# Patient Record
Sex: Female | Born: 1980 | Race: White | Hispanic: No | Marital: Single | Smoking: Current every day smoker
Health system: Southern US, Community
[De-identification: ages and names within clinical notes are randomized; demographics above are authoritative.]

## PROBLEM LIST (undated history)

## (undated) DIAGNOSIS — I10 Essential (primary) hypertension: Secondary | ICD-10-CM

## (undated) DIAGNOSIS — G43909 Migraine, unspecified, not intractable, without status migrainosus: Secondary | ICD-10-CM

## (undated) DIAGNOSIS — E079 Disorder of thyroid, unspecified: Secondary | ICD-10-CM

## (undated) DIAGNOSIS — F32A Depression, unspecified: Secondary | ICD-10-CM

## (undated) DIAGNOSIS — F329 Major depressive disorder, single episode, unspecified: Secondary | ICD-10-CM

## (undated) HISTORY — PX: CHOLECYSTECTOMY: SHX55

---

## 2007-03-13 ENCOUNTER — Ambulatory Visit (HOSPITAL_COMMUNITY): Admission: RE | Admit: 2007-03-13 | Discharge: 2007-03-13 | Payer: Self-pay | Admitting: Family Medicine

## 2007-03-28 ENCOUNTER — Ambulatory Visit: Payer: Self-pay | Admitting: Family Medicine

## 2007-03-29 ENCOUNTER — Ambulatory Visit: Payer: Self-pay | Admitting: Obstetrics & Gynecology

## 2007-04-03 ENCOUNTER — Ambulatory Visit (HOSPITAL_COMMUNITY): Admission: RE | Admit: 2007-04-03 | Discharge: 2007-04-03 | Payer: Self-pay | Admitting: Family Medicine

## 2007-04-10 ENCOUNTER — Ambulatory Visit: Payer: Self-pay | Admitting: Obstetrics and Gynecology

## 2007-04-18 ENCOUNTER — Ambulatory Visit: Payer: Self-pay | Admitting: Obstetrics & Gynecology

## 2007-04-24 ENCOUNTER — Ambulatory Visit (HOSPITAL_COMMUNITY): Admission: RE | Admit: 2007-04-24 | Discharge: 2007-04-24 | Payer: Self-pay | Admitting: Family Medicine

## 2007-05-07 ENCOUNTER — Ambulatory Visit (HOSPITAL_COMMUNITY): Admission: RE | Admit: 2007-05-07 | Discharge: 2007-05-07 | Payer: Self-pay | Admitting: Obstetrics & Gynecology

## 2007-05-16 ENCOUNTER — Ambulatory Visit: Payer: Self-pay | Admitting: *Deleted

## 2007-05-23 ENCOUNTER — Ambulatory Visit (HOSPITAL_COMMUNITY): Admission: RE | Admit: 2007-05-23 | Discharge: 2007-05-23 | Payer: Self-pay | Admitting: Obstetrics & Gynecology

## 2007-06-13 ENCOUNTER — Ambulatory Visit (HOSPITAL_COMMUNITY): Admission: RE | Admit: 2007-06-13 | Discharge: 2007-06-13 | Payer: Self-pay | Admitting: Obstetrics & Gynecology

## 2007-06-13 ENCOUNTER — Ambulatory Visit: Payer: Self-pay | Admitting: Obstetrics & Gynecology

## 2007-07-01 ENCOUNTER — Ambulatory Visit: Payer: Self-pay | Admitting: Family Medicine

## 2007-07-05 ENCOUNTER — Ambulatory Visit: Payer: Self-pay | Admitting: Cardiology

## 2007-07-05 LAB — CONVERTED CEMR LAB
BUN: 6 mg/dL (ref 6–23)
Chloride: 106 meq/L (ref 96–112)
Creatinine, Ser: 0.5 mg/dL (ref 0.4–1.2)
GFR calc non Af Amer: 156 mL/min
Magnesium: 1.9 mg/dL (ref 1.5–2.5)
TSH: 3.12 microintl units/mL (ref 0.35–5.50)

## 2007-07-15 ENCOUNTER — Ambulatory Visit (HOSPITAL_COMMUNITY): Admission: RE | Admit: 2007-07-15 | Discharge: 2007-07-15 | Payer: Self-pay | Admitting: Obstetrics & Gynecology

## 2007-07-15 ENCOUNTER — Ambulatory Visit: Payer: Self-pay | Admitting: Obstetrics & Gynecology

## 2007-07-17 ENCOUNTER — Encounter: Payer: Self-pay | Admitting: Cardiology

## 2007-07-17 ENCOUNTER — Ambulatory Visit: Payer: Self-pay

## 2007-07-29 ENCOUNTER — Ambulatory Visit: Payer: Self-pay | Admitting: Obstetrics & Gynecology

## 2007-08-05 ENCOUNTER — Ambulatory Visit: Payer: Self-pay | Admitting: Obstetrics & Gynecology

## 2007-08-05 ENCOUNTER — Ambulatory Visit (HOSPITAL_COMMUNITY): Admission: RE | Admit: 2007-08-05 | Discharge: 2007-08-05 | Payer: Self-pay | Admitting: Gynecology

## 2007-08-19 ENCOUNTER — Ambulatory Visit: Payer: Self-pay | Admitting: Obstetrics & Gynecology

## 2007-08-22 ENCOUNTER — Ambulatory Visit: Payer: Self-pay | Admitting: Family Medicine

## 2007-08-29 ENCOUNTER — Ambulatory Visit: Payer: Self-pay | Admitting: *Deleted

## 2007-09-02 ENCOUNTER — Ambulatory Visit: Payer: Self-pay | Admitting: Obstetrics & Gynecology

## 2007-09-05 ENCOUNTER — Ambulatory Visit: Payer: Self-pay | Admitting: Obstetrics & Gynecology

## 2007-09-05 ENCOUNTER — Ambulatory Visit (HOSPITAL_COMMUNITY): Admission: RE | Admit: 2007-09-05 | Discharge: 2007-09-05 | Payer: Self-pay | Admitting: Obstetrics & Gynecology

## 2007-09-09 ENCOUNTER — Ambulatory Visit: Payer: Self-pay | Admitting: Gynecology

## 2007-09-12 ENCOUNTER — Ambulatory Visit: Payer: Self-pay | Admitting: Obstetrics & Gynecology

## 2007-09-16 ENCOUNTER — Ambulatory Visit: Payer: Self-pay | Admitting: Obstetrics & Gynecology

## 2007-09-19 ENCOUNTER — Ambulatory Visit: Payer: Self-pay | Admitting: Obstetrics & Gynecology

## 2007-09-19 ENCOUNTER — Ambulatory Visit (HOSPITAL_COMMUNITY): Admission: RE | Admit: 2007-09-19 | Discharge: 2007-09-19 | Payer: Self-pay | Admitting: Family Medicine

## 2007-09-23 ENCOUNTER — Ambulatory Visit: Payer: Self-pay | Admitting: Family Medicine

## 2007-09-27 ENCOUNTER — Ambulatory Visit: Payer: Self-pay | Admitting: Gynecology

## 2007-09-29 ENCOUNTER — Inpatient Hospital Stay (HOSPITAL_COMMUNITY): Admission: AD | Admit: 2007-09-29 | Discharge: 2007-10-02 | Payer: Self-pay | Admitting: Obstetrics & Gynecology

## 2007-09-29 ENCOUNTER — Ambulatory Visit: Payer: Self-pay | Admitting: Obstetrics and Gynecology

## 2008-07-15 IMAGING — US US OB COMP +14 WK
1 series · 4 of 4 positions shown · non-contrast
Comparison: none

OBSTETRICAL ULTRASOUND:

 This ultrasound exam was performed in the [HOSPITAL] Ultrasound Department.  The OB US report was generated in the AS system, and faxed to the ordering physician.  This report is also available in [REDACTED] PACS.

[Series 1: us ob comp +14 wk · 0.28mm/px · 4 of 4 slices shown]
[im 1/4]
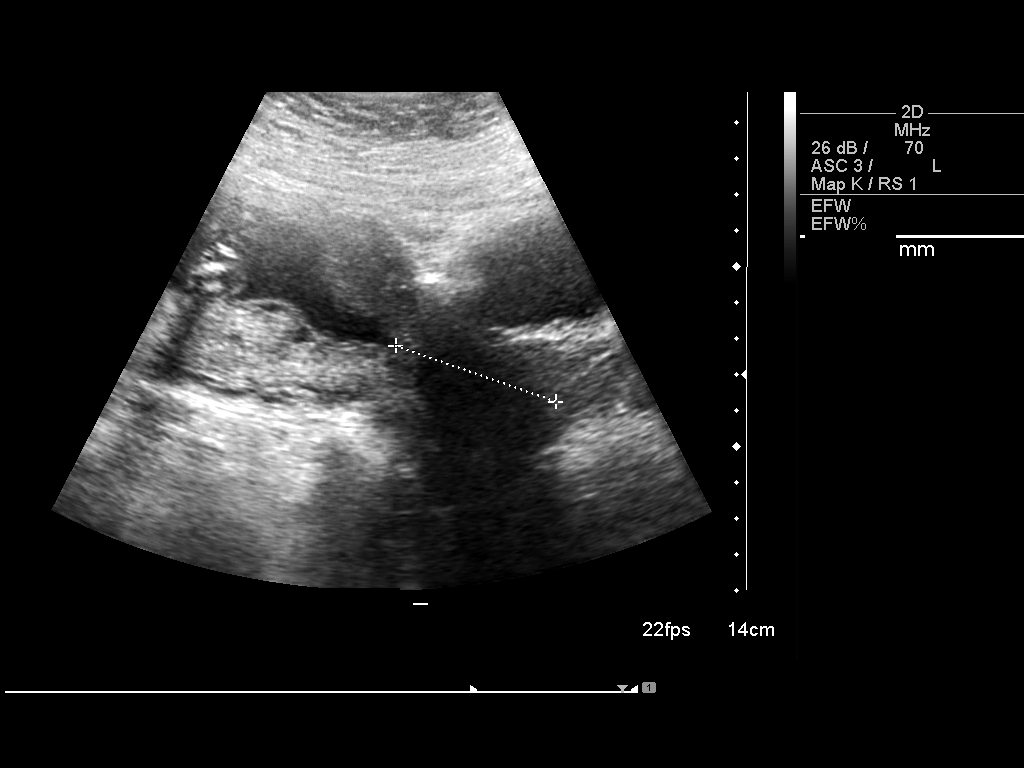
[im 2/4]
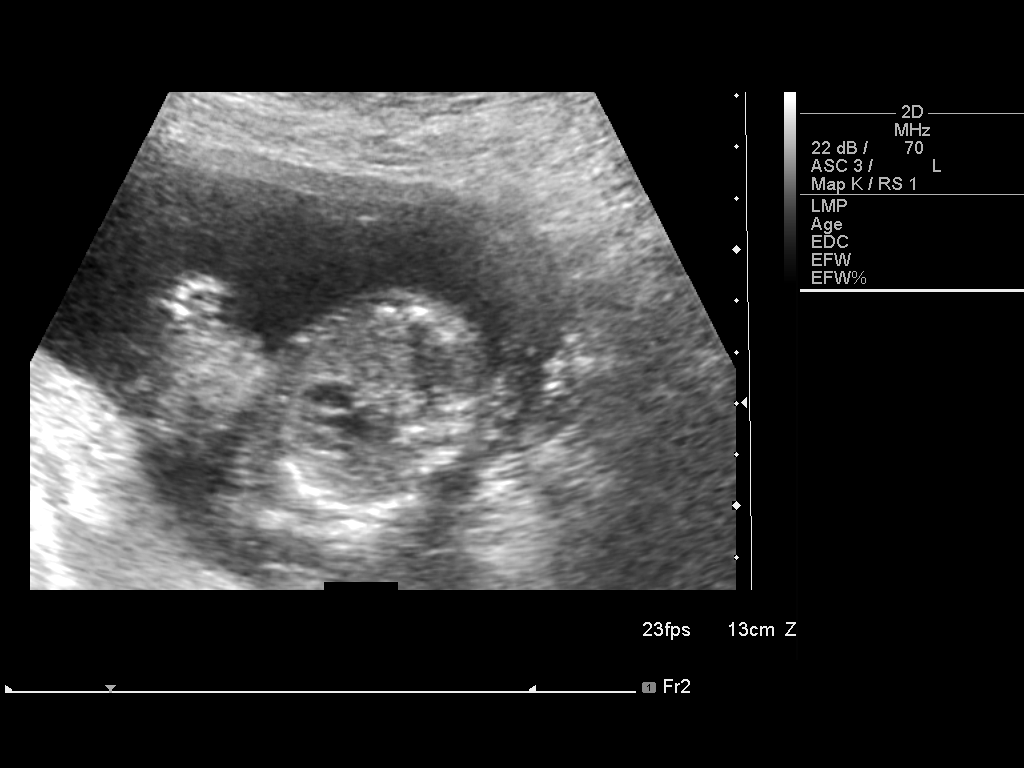
[im 3/4]
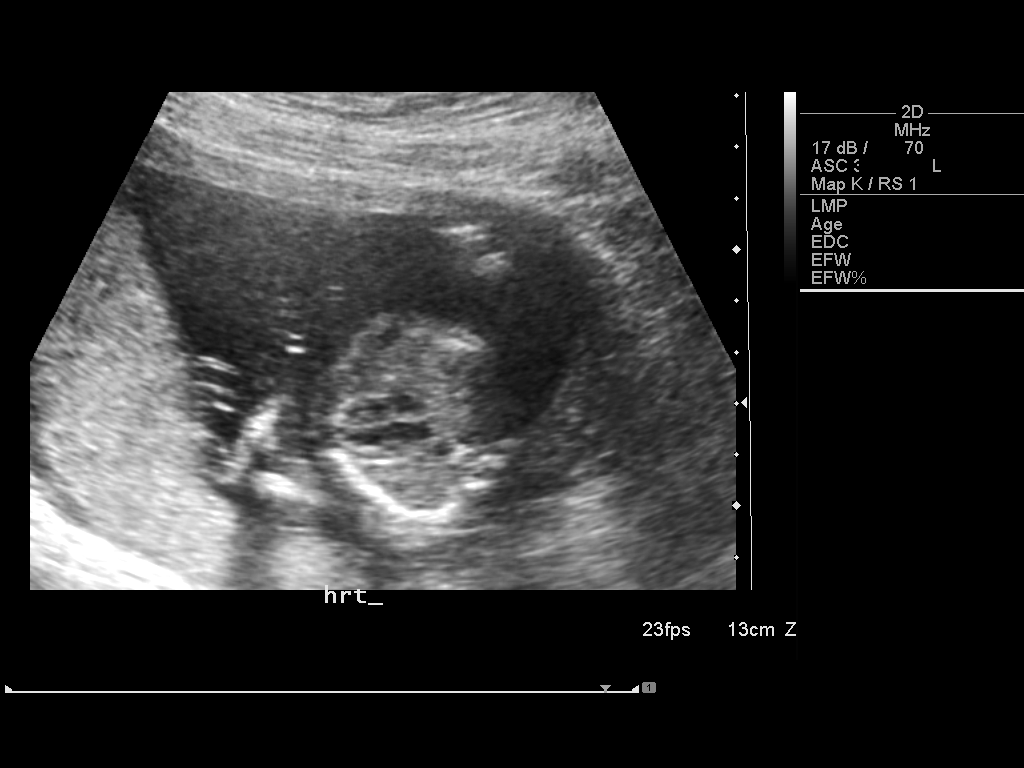
[im 4/4]
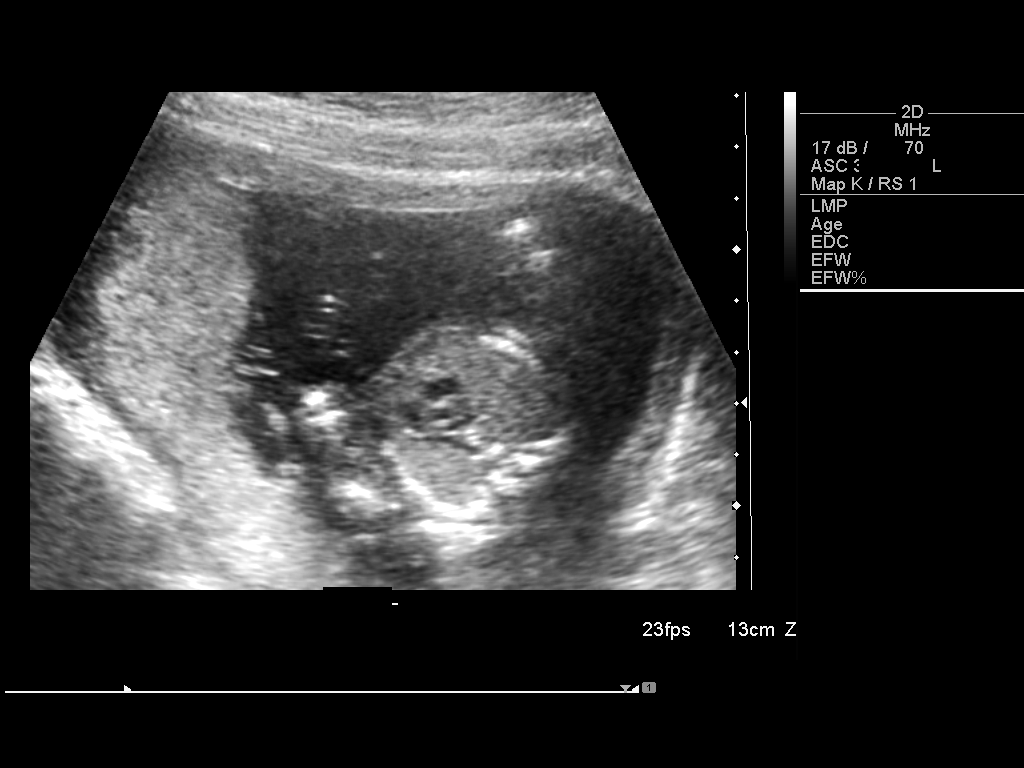

[4 of 4 positions shown; findings below may reference images not displayed]

IMPRESSION: See AS Obstetric US report.

## 2008-11-27 IMAGING — US US OB FOLLOW-UP
1 series · 14 of 28 positions shown · non-contrast
Comparison: none

OBSTETRICAL ULTRASOUND:

 This ultrasound exam was performed in the [HOSPITAL] Ultrasound Department.  The OB US report was generated in the AS system, and faxed to the ordering physician.  This report is also available in [REDACTED] PACS.

[Series 1: us ob re-eval · 14 of 31 slices shown]
[im 2/31]
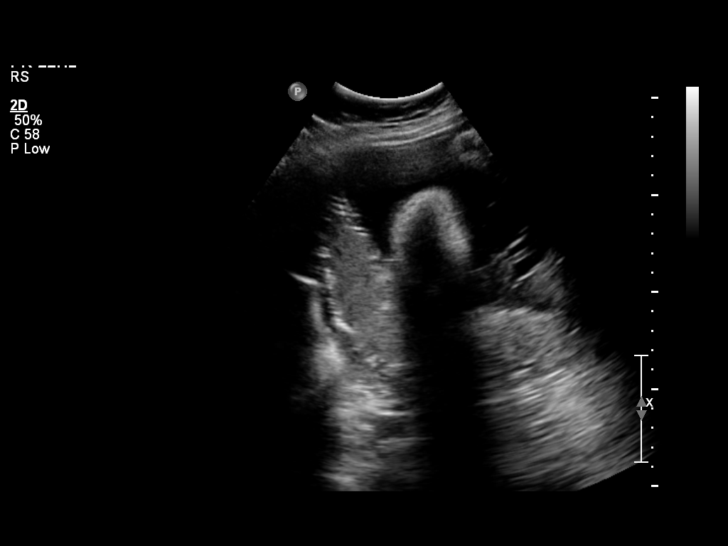
[im 4/31]
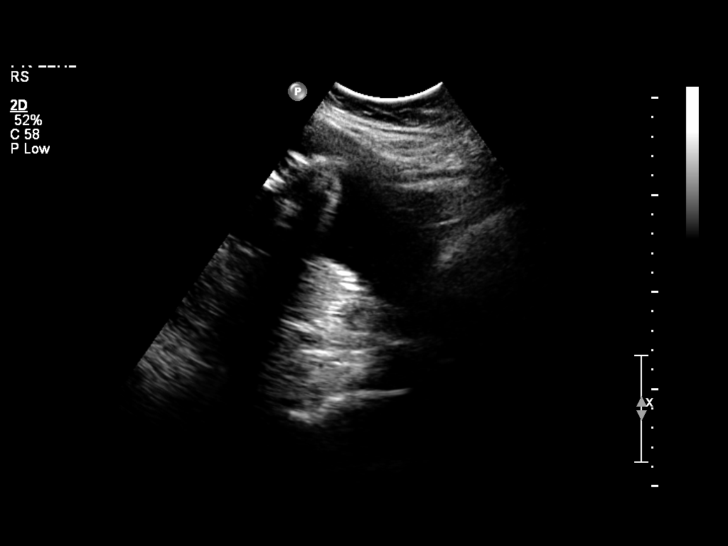
[im 6/31]
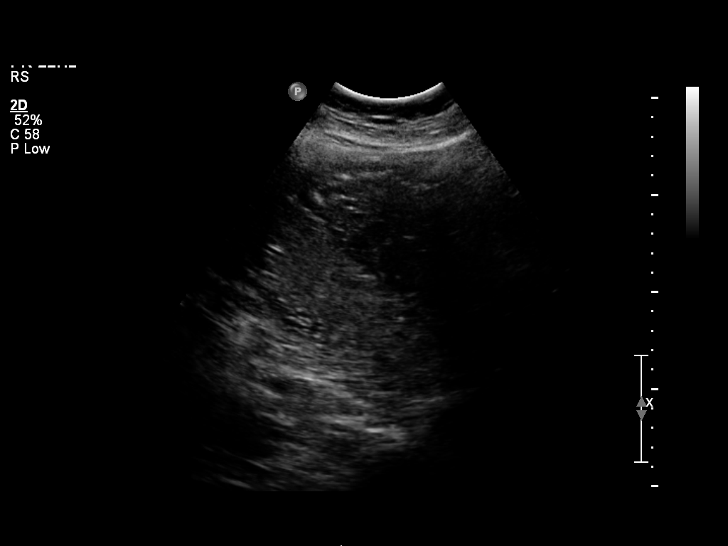
[im 8/31]
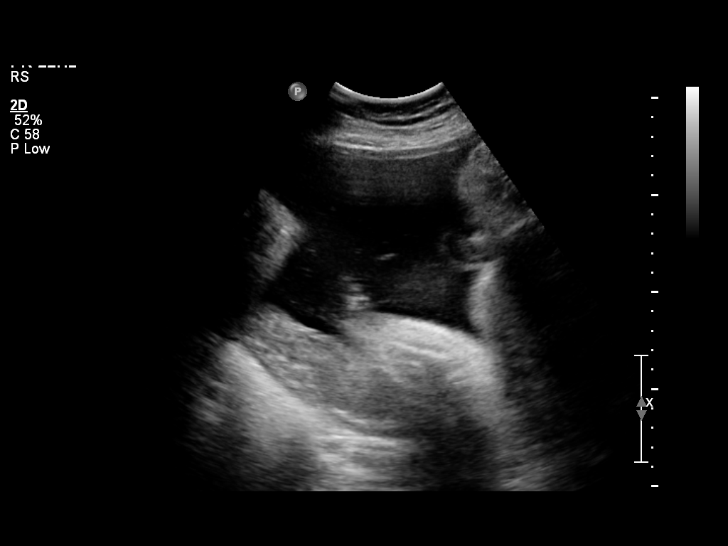
[im 11/31]
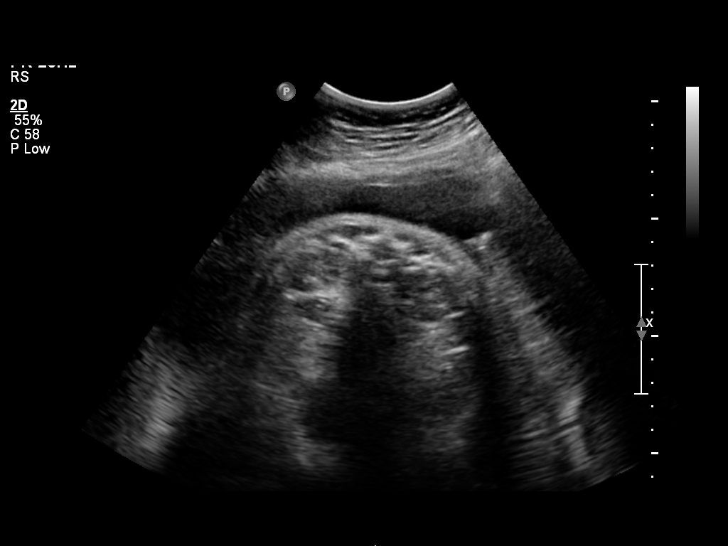
[im 13/31]
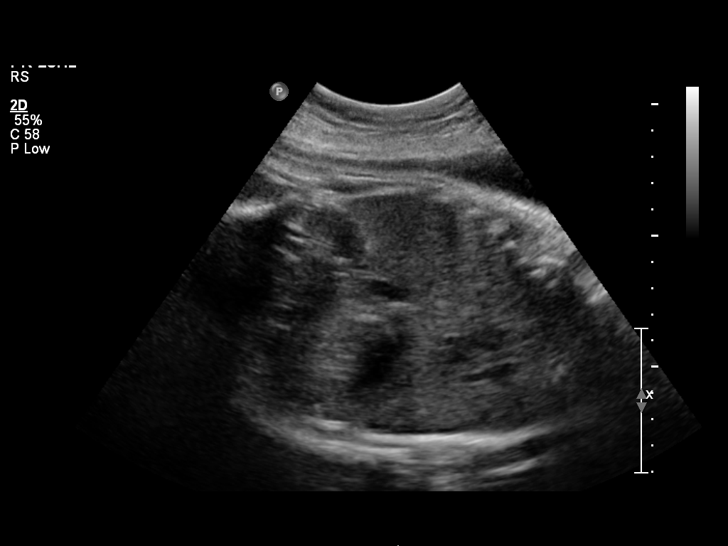
[im 15/31]
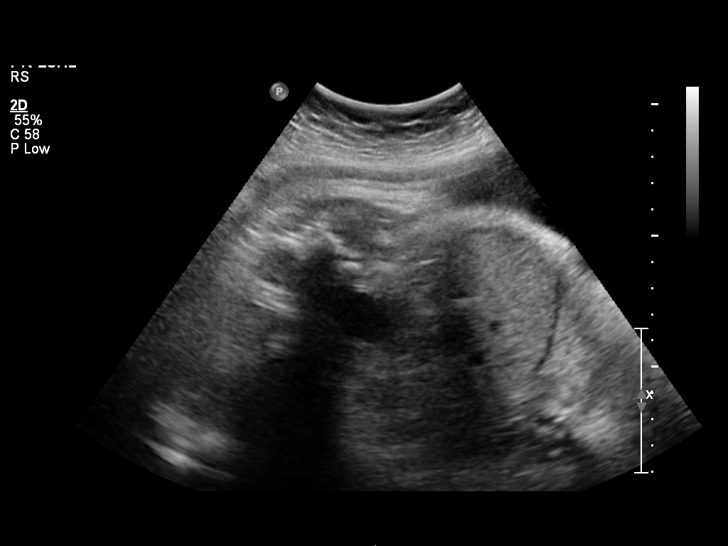
[im 17/31]
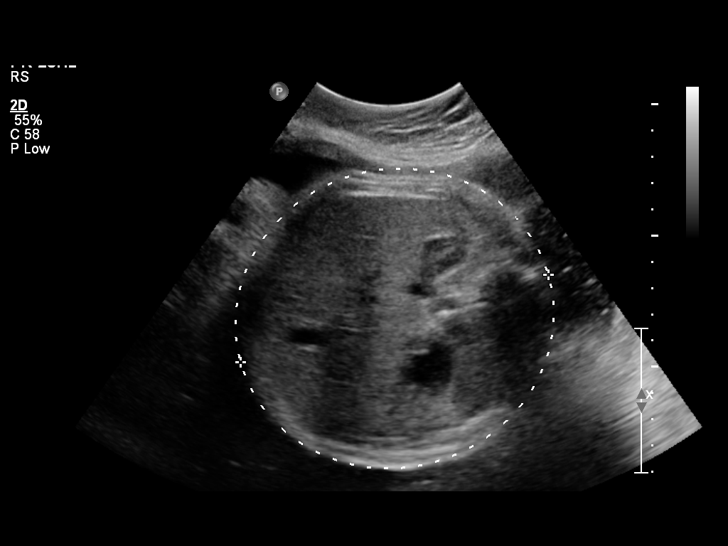
[im 19/31]
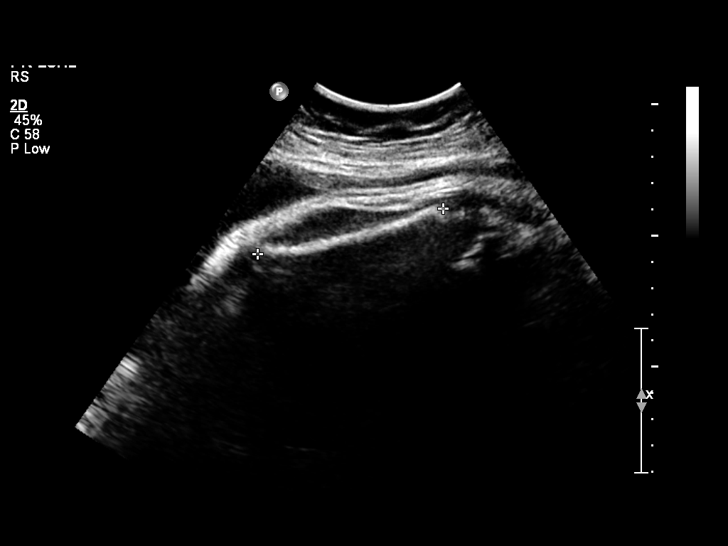
[im 22/31]
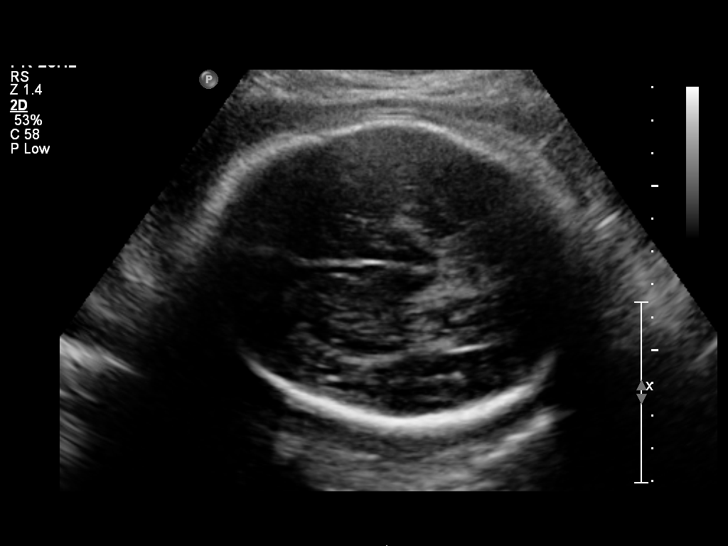
[im 24/31]
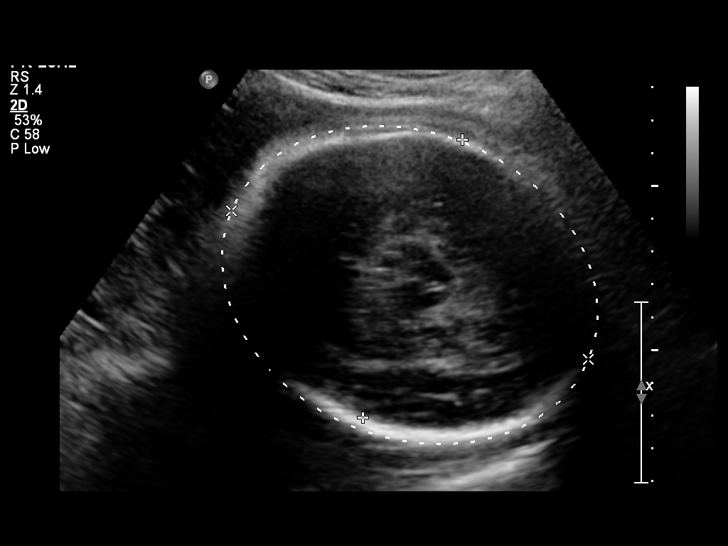
[im 26/31]
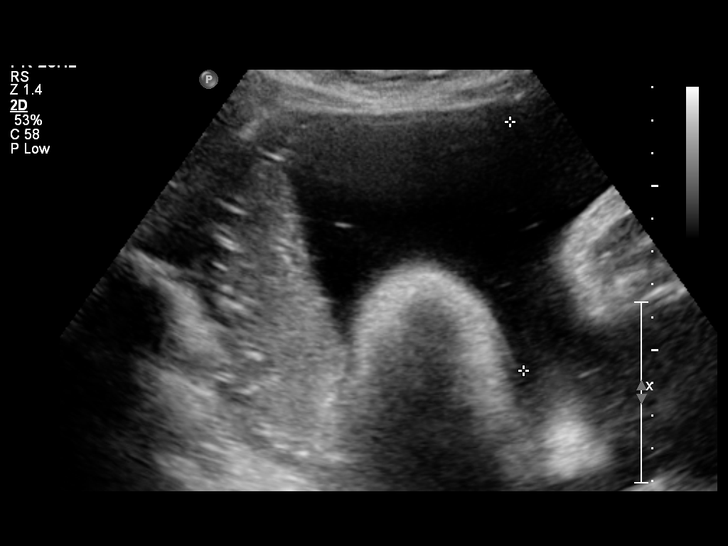
[im 28/31]
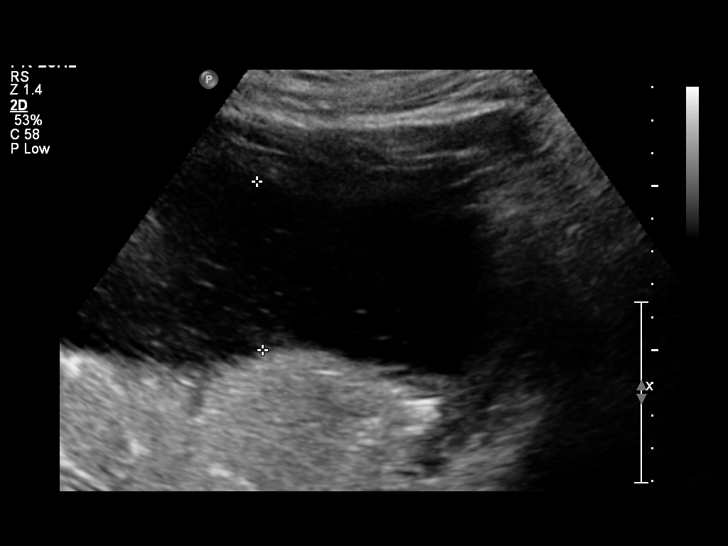
[im 31/31]
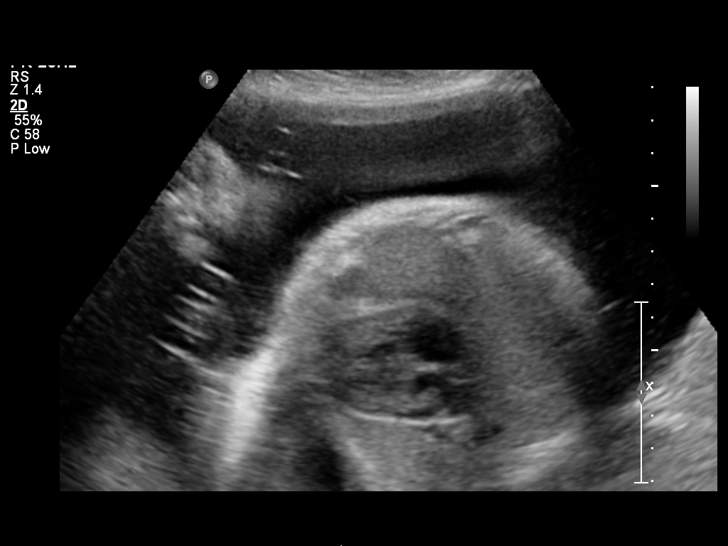

[14 of 28 positions shown; findings below may reference images not displayed]

IMPRESSION: See AS Obstetric US report.

## 2010-01-23 ENCOUNTER — Ambulatory Visit: Payer: Self-pay | Admitting: Diagnostic Radiology

## 2010-01-23 ENCOUNTER — Ambulatory Visit: Payer: Self-pay | Admitting: Gastroenterology

## 2010-01-23 ENCOUNTER — Inpatient Hospital Stay (HOSPITAL_COMMUNITY): Admission: EM | Admit: 2010-01-23 | Discharge: 2010-01-26 | Payer: Self-pay | Admitting: Internal Medicine

## 2010-01-23 ENCOUNTER — Encounter: Payer: Self-pay | Admitting: Emergency Medicine

## 2010-01-25 ENCOUNTER — Encounter (INDEPENDENT_AMBULATORY_CARE_PROVIDER_SITE_OTHER): Payer: Self-pay | Admitting: Internal Medicine

## 2010-01-31 ENCOUNTER — Ambulatory Visit: Payer: Self-pay | Admitting: Internal Medicine

## 2010-02-01 ENCOUNTER — Encounter: Payer: Self-pay | Admitting: Internal Medicine

## 2010-02-02 ENCOUNTER — Encounter: Payer: Self-pay | Admitting: Internal Medicine

## 2010-02-02 LAB — CONVERTED CEMR LAB
Albumin: 4.1 g/dL (ref 3.5–5.2)
Alkaline Phosphatase: 73 units/L (ref 39–117)
Basophils Relative: 0.1 % (ref 0.0–3.0)
Bilirubin, Direct: 0.1 mg/dL (ref 0.0–0.3)
Eosinophils Relative: 3.4 % (ref 0.0–5.0)
Lipase: 23 units/L (ref 11.0–59.0)
MCV: 89.1 fL (ref 78.0–100.0)
Monocytes Absolute: 0.4 10*3/uL (ref 0.1–1.0)
Monocytes Relative: 5.7 % (ref 3.0–12.0)
Neutrophils Relative %: 67 % (ref 43.0–77.0)
Platelets: 231 10*3/uL (ref 150.0–400.0)
RBC: 4.5 M/uL (ref 3.87–5.11)
Total Protein: 7.4 g/dL (ref 6.0–8.3)
WBC: 6.9 10*3/uL (ref 4.5–10.5)

## 2010-02-14 ENCOUNTER — Ambulatory Visit: Payer: Self-pay | Admitting: Internal Medicine

## 2010-02-14 LAB — CONVERTED CEMR LAB
ALT: 19 units/L (ref 0–35)
AST: 13 units/L (ref 0–37)
Alkaline Phosphatase: 68 units/L (ref 39–117)
Bilirubin, Direct: 0.1 mg/dL (ref 0.0–0.3)
Total Bilirubin: 0.3 mg/dL (ref 0.3–1.2)
Total Protein: 6.9 g/dL (ref 6.0–8.3)

## 2010-09-22 ENCOUNTER — Emergency Department (HOSPITAL_BASED_OUTPATIENT_CLINIC_OR_DEPARTMENT_OTHER): Admission: EM | Admit: 2010-09-22 | Discharge: 2010-09-22 | Payer: Self-pay | Admitting: Emergency Medicine

## 2010-09-23 ENCOUNTER — Emergency Department (HOSPITAL_COMMUNITY): Admission: EM | Admit: 2010-09-23 | Discharge: 2010-09-23 | Payer: Self-pay | Admitting: Emergency Medicine

## 2011-01-16 ENCOUNTER — Encounter: Payer: Self-pay | Admitting: *Deleted

## 2011-01-24 NOTE — Letter (Signed)
Summary: Results Letter  [facility]  [address] [place], [place] [address]   Phone: [phone]  Fax: [phone]        February 02, 2010 MRN: [id number]    [facility] [address] [place], [place]  [address]    Dear Ms. Hout,     I have been trying to reach you by phone, but have been unsuccessful, so I am sending this letter. Dr.Sefora Tietje reviewed the labs you had drawn on 01-31-10. Your liver function levels are back to normal, except for one enzyme. Dr.Mauri Temkin would like to repeat your lab work in 2 weeks to follow-up on this. The orders are in the computer for 02-14-10. Please come to the [place] lab M-F, 7:30am-5:30pm, to get those done. If you have any questions please call [phone]. Thank You.       Sincerely,  Laureen Ochs LPN  This letter has been electronically signed by your physician.  Appended Document: Results Letter Letter mailed to patient.

## 2011-01-24 NOTE — Letter (Signed)
Summary: [facility]   Imported By: Sherian Rein 02/16/2010 15:17:30  _____________________________________________________________________  External Attachment:    Type:   Image     Comment:   External Document

## 2011-03-09 LAB — COMPREHENSIVE METABOLIC PANEL
BUN: 10 mg/dL (ref 6–23)
CO2: 26 mEq/L (ref 19–32)
Calcium: 8.7 mg/dL (ref 8.4–10.5)
Creatinine, Ser: 0.7 mg/dL (ref 0.4–1.2)
GFR calc non Af Amer: >60 mL/min (ref >60)
Glucose, Bld: 90 mg/dL (ref 70–99)

## 2011-03-09 LAB — URINALYSIS, ROUTINE W REFLEX MICROSCOPIC
Glucose, UA: NEGATIVE mg/dL
Ketones, ur: 15 mg/dL — AB
Nitrite: NEGATIVE
Protein, ur: NEGATIVE mg/dL
Protein, ur: NEGATIVE mg/dL
Specific Gravity, Urine: 1.021 (ref 1.005–1.030)
Urobilinogen, UA: 0.2 mg/dL (ref 0.0–1.0)
Urobilinogen, UA: 0.2 mg/dL (ref 0.0–1.0)

## 2011-03-09 LAB — CBC
HCT: 42.4 % (ref 36.0–46.0)
Hemoglobin: 13.1 g/dL (ref 12.0–15.0)
MCH: 29 pg (ref 26.0–34.0)
MCH: 29.8 pg (ref 26.0–34.0)
MCHC: 33.3 g/dL (ref 30.0–36.0)
MCV: 87.1 fL (ref 78.0–100.0)
Platelets: 226 10*3/uL (ref 150–400)
RBC: 4.87 MIL/uL (ref 3.87–5.11)
RDW: 12.1 % (ref 11.5–15.5)
WBC: 7.2 10*3/uL (ref 4.0–10.5)

## 2011-03-09 LAB — DIFFERENTIAL
Basophils Absolute: 0 10*3/uL (ref 0.0–0.1)
Basophils Relative: 1 % (ref 0–1)
Eosinophils Absolute: 0.2 10*3/uL (ref 0.0–0.7)
Eosinophils Relative: 4 % (ref 0–5)
Lymphocytes Relative: 32 % (ref 12–46)
Lymphs Abs: 2.3 10*3/uL (ref 0.7–4.0)
Monocytes Absolute: 0.4 10*3/uL (ref 0.1–1.0)
Monocytes Relative: 7 % (ref 3–12)
Neutro Abs: 3.9 10*3/uL (ref 1.7–7.7)
Neutrophils Relative %: 55 % (ref 43–77)

## 2011-03-09 LAB — BASIC METABOLIC PANEL
BUN: 12 mg/dL (ref 6–23)
Calcium: 8.3 mg/dL — ABNORMAL LOW (ref 8.4–10.5)
Creatinine, Ser: 0.6 mg/dL (ref 0.4–1.2)
GFR calc non Af Amer: >60 mL/min (ref >60)
Glucose, Bld: 84 mg/dL (ref 70–99)

## 2011-03-09 LAB — POCT PREGNANCY, URINE: Preg Test, Ur: NEGATIVE

## 2011-03-13 LAB — DIFFERENTIAL
Basophils Absolute: 0.1 10*3/uL (ref 0.0–0.1)
Eosinophils Absolute: 0.2 10*3/uL (ref 0.0–0.7)
Eosinophils Relative: 2 % (ref 0–5)
Lymphocytes Relative: 16 % (ref 12–46)

## 2011-03-13 LAB — URINALYSIS, ROUTINE W REFLEX MICROSCOPIC
Hgb urine dipstick: NEGATIVE
Ketones, ur: NEGATIVE mg/dL
Protein, ur: NEGATIVE mg/dL
Urobilinogen, UA: 1 mg/dL (ref 0.0–1.0)

## 2011-03-13 LAB — CBC
HCT: 36.8 % (ref 36.0–46.0)
Hemoglobin: 13 g/dL (ref 12.0–15.0)
MCV: 86 fL (ref 78.0–100.0)
RBC: 4.27 MIL/uL (ref 3.87–5.11)
WBC: 10.5 10*3/uL (ref 4.0–10.5)

## 2011-03-13 LAB — LIPASE, BLOOD: Lipase: >2000 U/L — ABNORMAL HIGH (ref 23–300)

## 2011-03-13 LAB — COMPREHENSIVE METABOLIC PANEL
AST: 406 U/L — ABNORMAL HIGH (ref 0–37)
Albumin: 3.2 g/dL — ABNORMAL LOW (ref 3.5–5.2)
BUN: 5 mg/dL — ABNORMAL LOW (ref 6–23)
CO2: 26 mEq/L (ref 19–32)
Chloride: 109 mEq/L (ref 96–112)
Creatinine, Ser: 0.69 mg/dL (ref 0.4–1.2)
Creatinine, Ser: 0.7 mg/dL (ref 0.4–1.2)
GFR calc Af Amer: >60 mL/min (ref >60)
GFR calc non Af Amer: >60 mL/min (ref >60)
Glucose, Bld: 91 mg/dL (ref 70–99)
Potassium: 3.7 mEq/L (ref 3.5–5.1)
Total Bilirubin: 1.9 mg/dL — ABNORMAL HIGH (ref 0.3–1.2)
Total Protein: 5.6 g/dL — ABNORMAL LOW (ref 6.0–8.3)

## 2011-03-13 LAB — URINE MICROSCOPIC-ADD ON

## 2011-03-13 LAB — HEMOGLOBIN A1C
Hgb A1c MFr Bld: 5.4 % (ref 4.6–6.1)
Mean Plasma Glucose: 108 mg/dL

## 2011-03-13 LAB — LIPID PANEL: VLDL: 26 mg/dL (ref 0–40)

## 2011-03-13 LAB — HEPATITIS PANEL, ACUTE
HCV Ab: NEGATIVE
Hepatitis B Surface Ag: NEGATIVE

## 2011-03-16 LAB — COMPREHENSIVE METABOLIC PANEL
ALT: 83 U/L — ABNORMAL HIGH (ref 0–35)
Albumin: 3.3 g/dL — ABNORMAL LOW (ref 3.5–5.2)
Alkaline Phosphatase: 66 U/L (ref 39–117)
BUN: 6 mg/dL (ref 6–23)
Chloride: 107 mEq/L (ref 96–112)
Glucose, Bld: 80 mg/dL (ref 70–99)
Potassium: 3.7 mEq/L (ref 3.5–5.1)
Total Bilirubin: 0.5 mg/dL (ref 0.3–1.2)

## 2011-03-16 LAB — CBC
HCT: 33.8 % — ABNORMAL LOW (ref 36.0–46.0)
Hemoglobin: 11.4 g/dL — ABNORMAL LOW (ref 12.0–15.0)
Platelets: 164 10*3/uL (ref 150–400)
WBC: 6.5 10*3/uL (ref 4.0–10.5)

## 2011-03-16 LAB — PROTIME-INR: INR: 1.05 (ref 0.00–1.49)

## 2011-05-09 NOTE — Assessment & Plan Note (Signed)
[facility]                            CARDIOLOGY OFFICE NOTE   NAME:Daum, KIERAN NACHTIGAL                         MRN:          [id number]  DATE:07/05/2007                            DOB:          11/03/1981    CARDIOLOGY CONSULTATION:  I was asked by Dr. Jonette Eva to evaluate  [facility] with new-onset palpitations.   HISTORY OF PRESENT ILLNESS:  She is 30 years of age, single white  female.  She has never had any cardiac issues.   Over the last week or so, she has been having intermittent palpitations  almost daily.  They occur about 5-6 times a day.  They are usually of  single beats and then go away. She has not had any syncope or  presyncope.  She has not had any chest pain or shortness of breath.  There is nothing that provokes and nothing that eases them.   She does smoke about a half pack of cigarettes a day.  She has no  history of thyroid disease.  She does not use any recreational drugs,  specifically cocaine.  She does not drink alcohol.  She does have one  caffeinated soda a day.   Her past medical history otherwise is negative for any drug allergies or  dye allergies.   She is on prenatal vitamins.   FAMILY HISTORY:  There is no premature history of coronary disease in  first-degree relatives.   SOCIAL HISTORY:  She is a Conservation officer, nature.  She stands on her feet 6-8 hours a  day.   REVIEW OF SYSTEMS:  Positive for constipation, fatigue.  Apparently she  had an ulcer five years ago and was placed on Zantac.  Has a history of  gastroesophageal reflux, history of thyroid disease diagnosed in 2008.  She has not had a recent thyroid panel, according to her.  Anxiety,  depression.  The rest of the review of systems are negative.   PHYSICAL EXAMINATION:  GENERAL:  She is very pleasant.  VITAL SIGNS:  Blood pressure is 122/78, pulse is 102.  EKG shows sinus  tach, otherwise normal.  She is 5 feet 5 and weighs 245 pounds.  HEENT:  Normocephalic and  atraumatic.  PERRLA.  Extraocular movements  are intact.  Sclerae are clear.  Facial symmetry is normal.  NECK:  Carotids are equal bilaterally without bruits.  No JVD.  Thyroid  is not enlarged.  Trachea is midline.  LUNGS:  Clear.  HEART:  A poorly appreciated PMI.  She has no mitral valve prolapse,  click, or murmur.  S2 splits physiologically.  ABDOMEN:  Gravid.  There are good bowel sounds.  EXTREMITIES:  No edema.  Pulses are intact.  NEUROLOGIC:  Intact.  SKIN:  Unremarkable.   ASSESSMENT:  New onset of palpitations, probably benign.  Possible  premature atrial contractions, premature ventricular contractions.  We  need to rule out structural heart disease and any electrolyte or thyroid  abnormality.   PLAN:  1. A 2D echocardiogram to rule out structural heart disease.  2. Magnesium level.  3. Check potassium.  4. TSH.   Assuming these are all normal, I will give her simple reassurance.  We  will see her back on a p.r.n. basis.     Thomas C. Daleen Squibb, MD, [facility]  Electronically Signed    TCW/MedQ  DD: 07/05/2007  DT: 07/06/2007  Job #: [id number]   cc:   Shelbie Proctor. Shawnie Pons, M.D.

## 2011-09-02 ENCOUNTER — Encounter: Payer: Self-pay | Admitting: *Deleted

## 2011-09-02 DIAGNOSIS — I10 Essential (primary) hypertension: Secondary | ICD-10-CM | POA: Insufficient documentation

## 2011-09-02 DIAGNOSIS — K029 Dental caries, unspecified: Secondary | ICD-10-CM | POA: Insufficient documentation

## 2011-09-02 DIAGNOSIS — K089 Disorder of teeth and supporting structures, unspecified: Secondary | ICD-10-CM | POA: Insufficient documentation

## 2011-09-02 NOTE — ED Notes (Signed)
Describes toothache since this a.m. (Bottom left)

## 2011-09-03 ENCOUNTER — Emergency Department (HOSPITAL_BASED_OUTPATIENT_CLINIC_OR_DEPARTMENT_OTHER)
Admission: EM | Admit: 2011-09-03 | Discharge: 2011-09-03 | Disposition: A | Discharge status: Home / Self Care | Payer: Self-pay | Attending: Emergency Medicine | Admitting: Emergency Medicine

## 2011-09-03 HISTORY — DX: Essential (primary) hypertension: I10

## 2011-09-03 MED ORDER — PENICILLIN V POTASSIUM 500 MG PO TABS: 500.0000 mg | ORAL_TABLET | Freq: Four times a day (QID) | ORAL | Status: AC

## 2011-09-03 MED ORDER — TRAMADOL HCL 50 MG PO TABS: 50.0000 mg | ORAL_TABLET | Freq: Four times a day (QID) | ORAL | Status: AC | PRN

## 2011-09-03 MED ORDER — AMOXICILLIN 500 MG PO CAPS
500.0000 mg | ORAL_CAPSULE | Freq: Once | ORAL | Status: AC
  Administered 2011-09-03: 500 mg via ORAL
  Filled 2011-09-03: qty 1

## 2011-09-03 MED ORDER — IBUPROFEN 800 MG PO TABS
800.0000 mg | ORAL_TABLET | Freq: Once | ORAL | Status: AC
  Administered 2011-09-03: 800 mg via ORAL
  Filled 2011-09-03: qty 1

## 2011-09-03 MED ORDER — NAPROXEN 500 MG PO TABS: 500.0000 mg | ORAL_TABLET | Freq: Two times a day (BID) | ORAL | Status: AC

## 2011-09-03 NOTE — ED Provider Notes (Signed)
History     CSN: [id number] Arrival date & time: 09/03/2011 12:11 AM  Chief Complaint  Patient presents with  . Dental Pain   HPI Comments: Has not seen a dentist in 8 years. No facial swelling. Has not tried any home remedies.  Patient is a 30 y.o. female presenting with tooth pain. The history is provided by the patient. No language interpreter was used.  Dental PainThe primary symptoms include mouth pain. Primary symptoms do not include dental injury, oral lesions, headaches, fever, shortness of breath, sore throat or cough. The symptoms began 12 to 24 hours ago. The symptoms are worsening. The symptoms are new. The symptoms occur constantly.  Mouth pain occurs constantly. Mouth pain is worsening. Affected locations include: teeth. At its highest the mouth pain was at 10/10.  Additional symptoms include: dental sensitivity to temperature. Additional symptoms do not include: gum swelling, gum tenderness, purulent gums, trismus, jaw pain, facial swelling, trouble swallowing, pain with swallowing, excessive salivation, drooling and fatigue.    Past Medical History  Diagnosis Date  . Hypertension     Past Surgical History  Procedure Date  . Cholecystectomy     History reviewed. No pertinent family history.  History  Substance Use Topics  . Smoking status: Current Everyday Smoker  . Smokeless tobacco: Not on file  . Alcohol Use: No    OB History    Grav Para Term Preterm Abortions TAB SAB Ect Mult Living                  Review of Systems  Constitutional: Negative for fever, chills, activity change, appetite change and fatigue.  HENT: Negative for congestion, sore throat, facial swelling, rhinorrhea, drooling, trouble swallowing, neck pain and neck stiffness.   Respiratory: Negative for cough, chest tightness and shortness of breath.   Cardiovascular: Negative for chest pain and palpitations.  Gastrointestinal: Negative for nausea, vomiting and abdominal pain.    Genitourinary: Negative for dysuria, urgency and frequency.  Neurological: Negative for dizziness, weakness, light-headedness, numbness and headaches.  All other systems reviewed and are negative.    Physical Exam  BP 154/98  Pulse 88  Temp(Src) 98.3 F (36.8 C) (Oral)  Resp 20  Ht 5\' 5"  (1.651 m)  Wt 285 lb (129.275 kg)  BMI 47.43 kg/m2  SpO2 100%  LMP 08/25/2011  Physical Exam  Nursing note and vitals reviewed. Constitutional: She is oriented to person, place, and time.  HENT:  Head: Normocephalic and atraumatic.  Mouth/Throat: No oral lesions. Abnormal dentition. Dental caries present. No dental abscesses.         No facial swelling  Eyes: Conjunctivae and EOM are normal. Pupils are equal, round, and reactive to light.  Neck: Normal range of motion. Neck supple.  Cardiovascular: Normal rate, regular rhythm, normal heart sounds and intact distal pulses.  Exam reveals no gallop and no friction rub.   No murmur heard. Pulmonary/Chest: Effort normal and breath sounds normal. No respiratory distress.  Abdominal: Soft. Bowel sounds are normal. There is no tenderness.  Musculoskeletal: Normal range of motion. She exhibits no tenderness.  Neurological: She is alert and oriented to person, place, and time.  Skin: Skin is warm and dry. No rash noted.    ED Course  Procedures  MDM Dental caries with no sign of abscess. There is no facial swelling or constitutional symptoms. There is no evidence of abscess to the gingiva. Patient has a pretty significant dental carie. Likely root exposure. Will place patient on a  short course of antibiotics, anti-inflammatory medication and pain medication. She is instructed to followup with a dentist as soon as possible as her tooth will likely require removal. There is no evidence of Ludwig's angina or facial abscess.      Dayton Bailiff, MD 09/03/11 [phone]

## 2011-10-05 LAB — POCT URINALYSIS DIP (DEVICE)
Bilirubin Urine: NEGATIVE
Glucose, UA: 500 — AB
Hgb urine dipstick: NEGATIVE
Operator id: 297281
Specific Gravity, Urine: >=1.03
Urobilinogen, UA: 0.2

## 2011-10-05 LAB — CBC
Hemoglobin: 12.9
MCHC: 34.8
MCV: 89.6
Platelets: 181
RBC: 2.91 — ABNORMAL LOW
RDW: 12.8
WBC: 12.4 — ABNORMAL HIGH

## 2011-10-05 LAB — COMPREHENSIVE METABOLIC PANEL
AST: 18
BUN: 7
CO2: 22
Calcium: 8.9
Creatinine, Ser: 0.63
GFR calc Af Amer: >60
GFR calc non Af Amer: >60
Glucose, Bld: 98
Total Bilirubin: 0.3

## 2011-10-05 LAB — LACTATE DEHYDROGENASE: LDH: 166

## 2011-10-05 LAB — URIC ACID: Uric Acid, Serum: 4.4

## 2011-10-06 LAB — POCT URINALYSIS DIP (DEVICE)
Glucose, UA: 100 — AB
Operator id: 120861
Specific Gravity, Urine: >=1.03
Urobilinogen, UA: 1

## 2011-10-09 LAB — POCT URINALYSIS DIP (DEVICE)
Bilirubin Urine: NEGATIVE
Bilirubin Urine: NEGATIVE
Glucose, UA: NEGATIVE
Glucose, UA: NEGATIVE
Glucose, UA: NEGATIVE
Nitrite: NEGATIVE
Nitrite: NEGATIVE
Nitrite: NEGATIVE
Operator id: 120861
Operator id: 194561
Operator id: 14902
Specific Gravity, Urine: 1.025
Specific Gravity, Urine: >=1.03
Urobilinogen, UA: 0.2
Urobilinogen, UA: 0.2
Urobilinogen, UA: 0.2

## 2011-10-10 LAB — POCT URINALYSIS DIP (DEVICE)
Glucose, UA: 250 — AB
Nitrite: NEGATIVE
Operator id: 148111
Urobilinogen, UA: 0.2

## 2011-10-11 LAB — POCT URINALYSIS DIP (DEVICE)
Glucose, UA: NEGATIVE
Hgb urine dipstick: NEGATIVE
Nitrite: NEGATIVE
Operator id: 120861
Urobilinogen, UA: 0.2

## 2012-09-07 ENCOUNTER — Emergency Department (HOSPITAL_BASED_OUTPATIENT_CLINIC_OR_DEPARTMENT_OTHER): Payer: Self-pay

## 2012-09-07 ENCOUNTER — Encounter (HOSPITAL_BASED_OUTPATIENT_CLINIC_OR_DEPARTMENT_OTHER): Payer: Self-pay | Admitting: *Deleted

## 2012-09-07 ENCOUNTER — Emergency Department (HOSPITAL_BASED_OUTPATIENT_CLINIC_OR_DEPARTMENT_OTHER)
Admission: EM | Admit: 2012-09-07 | Discharge: 2012-09-07 | Disposition: A | Discharge status: Home / Self Care | Payer: Self-pay | Attending: Emergency Medicine | Admitting: Emergency Medicine

## 2012-09-07 DIAGNOSIS — F17200 Nicotine dependence, unspecified, uncomplicated: Secondary | ICD-10-CM | POA: Insufficient documentation

## 2012-09-07 DIAGNOSIS — S93402A Sprain of unspecified ligament of left ankle, initial encounter: Secondary | ICD-10-CM

## 2012-09-07 DIAGNOSIS — S93499A Sprain of other ligament of unspecified ankle, initial encounter: Secondary | ICD-10-CM | POA: Insufficient documentation

## 2012-09-07 DIAGNOSIS — W108XXA Fall (on) (from) other stairs and steps, initial encounter: Secondary | ICD-10-CM | POA: Insufficient documentation

## 2012-09-07 DIAGNOSIS — I10 Essential (primary) hypertension: Secondary | ICD-10-CM | POA: Insufficient documentation

## 2012-09-07 MED ORDER — NAPROXEN 500 MG PO TABS: 500.0000 mg | ORAL_TABLET | Freq: Two times a day (BID) | ORAL | Status: AC

## 2012-09-07 MED ORDER — HYDROCODONE-ACETAMINOPHEN 5-325 MG PO TABS: 1.0000 | ORAL_TABLET | Freq: Four times a day (QID) | ORAL | Status: AC | PRN

## 2012-09-07 MED ORDER — HYDROCODONE-ACETAMINOPHEN 5-325 MG PO TABS
1.0000 | ORAL_TABLET | Freq: Once | ORAL | Status: AC
  Administered 2012-09-07: 1 via ORAL
  Filled 2012-09-07: qty 1

## 2012-09-07 NOTE — ED Notes (Signed)
Left ankle injury. Fell down the steps this morning and felt her ankle snap. Now c/o pain and swelling.

## 2012-09-07 NOTE — ED Provider Notes (Signed)
History     CSN: [id number]  Arrival date & time 09/07/12  1118   First MD Initiated Contact with Patient 09/07/12 1146      Chief Complaint  Patient presents with  . Ankle Pain    (Consider location/radiation/quality/duration/timing/severity/associated sxs/prior treatment) Patient is a 31 y.o. female presenting with ankle pain. The history is provided by the patient.  Ankle Pain  The incident occurred 3 to 5 hours ago. The incident occurred at home. Injury mechanism: Tripped with twisting of the ankle. The pain is present in the left ankle. The quality of the pain is described as sharp and aching. The pain is at a severity of 8/10. The pain is moderate. The pain has been constant since onset. Associated symptoms include inability to bear weight and loss of motion. Pertinent negatives include no numbness, no muscle weakness, no loss of sensation and no tingling.    Past Medical History  Diagnosis Date  . Hypertension     Past Surgical History  Procedure Date  . Cholecystectomy     No family history on file.  History  Substance Use Topics  . Smoking status: Current Every Day Smoker  . Smokeless tobacco: Not on file  . Alcohol Use: No    OB History    Grav Para Term Preterm Abortions TAB SAB Ect Mult Living                  Review of Systems  Constitutional: Negative for fever.  HENT: Negative for neck pain.   Respiratory: Negative for shortness of breath.   Cardiovascular: Negative for chest pain.  Gastrointestinal: Negative for abdominal pain.  Genitourinary: Negative for dysuria.  Musculoskeletal: Positive for joint swelling. Negative for back pain.  Skin: Negative for rash.  Neurological: Negative for tingling, numbness and headaches.  Hematological: Does not bruise/bleed easily.    Allergies  Review of patient's allergies indicates no known allergies.  Home Medications   Current Outpatient Rx  Name Route Sig Dispense Refill  .  HYDROCODONE-ACETAMINOPHEN 5-325 MG PO TABS Oral Take 1-2 tablets by mouth every 6 (six) hours as needed for pain. 10 tablet 0  . NAPROXEN 500 MG PO TABS Oral Take 1 tablet (500 mg total) by mouth 2 (two) times daily. 14 tablet 0    BP 150/111  Pulse 97  Temp 97.7 F (36.5 C) (Oral)  Resp 20  SpO2 100%  LMP 09/05/2012  Physical Exam  Nursing note and vitals reviewed. Constitutional: She is oriented to person, place, and time. She appears well-developed and well-nourished.  HENT:  Head: Normocephalic and atraumatic.  Eyes: Conjunctivae normal and EOM are normal. Pupils are equal, round, and reactive to light.  Neck: Normal range of motion. Neck supple.  Cardiovascular: Normal rate, regular rhythm and normal heart sounds.   Pulmonary/Chest: Effort normal and breath sounds normal.  Abdominal: Soft. Bowel sounds are normal. There is no tenderness.  Musculoskeletal: Normal range of motion. She exhibits tenderness.       Normal except for left ankle area with some lateral swelling and tenderness. No obvious deformity no proximal fibular tenderness. Dorsalis pedis pulses 2+ cap refill is 1 second sensation is intact good movement of toes. Limited range of motion of the ankle.  Neurological: She is alert and oriented to person, place, and time. No cranial nerve deficit. She exhibits normal muscle tone. Coordination normal.  Skin: Skin is warm. No rash noted.    ED Course  Procedures (including critical care time)  Labs Reviewed - No data to display Dg Ankle Complete Left  09/07/2012  *RADIOLOGY REPORT*  Clinical Data: Larey Seat down stairs and injured left ankle.  LEFT ANKLE COMPLETE - 3+ VIEW  Comparison: None.  Findings: No evidence of acute fracture or dislocation.  Ankle mortise intact with well-preserved joint space.  Lucency involving the lateral aspect of the talar dome.  Small plantar calcaneal spur.  Accessory ossicle posterior to the talus.  IMPRESSION: No acute osseous abnormality.   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   Original Report Authenticated By: Arnell Sieving, M.D.      1. Left ankle sprain       MDM  X-ray is not consistent with a fracture. Symptoms are consistent with an ankle sprain. Will treat with crutches as needed. ASO wrap to provide support. Orthopedic referral provided. No other injuries.        Shelda Jakes, MD 09/07/12 1230

## 2013-03-05 ENCOUNTER — Encounter (HOSPITAL_BASED_OUTPATIENT_CLINIC_OR_DEPARTMENT_OTHER): Payer: Self-pay | Admitting: *Deleted

## 2013-03-05 ENCOUNTER — Emergency Department (HOSPITAL_BASED_OUTPATIENT_CLINIC_OR_DEPARTMENT_OTHER)
Admission: EM | Admit: 2013-03-05 | Discharge: 2013-03-05 | Disposition: A | Discharge status: Home / Self Care | Payer: Medicaid Other | Attending: Emergency Medicine | Admitting: Emergency Medicine

## 2013-03-05 DIAGNOSIS — F17200 Nicotine dependence, unspecified, uncomplicated: Secondary | ICD-10-CM | POA: Insufficient documentation

## 2013-03-05 DIAGNOSIS — G43909 Migraine, unspecified, not intractable, without status migrainosus: Secondary | ICD-10-CM

## 2013-03-05 DIAGNOSIS — H53149 Visual discomfort, unspecified: Secondary | ICD-10-CM | POA: Insufficient documentation

## 2013-03-05 DIAGNOSIS — I10 Essential (primary) hypertension: Secondary | ICD-10-CM | POA: Insufficient documentation

## 2013-03-05 DIAGNOSIS — R209 Unspecified disturbances of skin sensation: Secondary | ICD-10-CM | POA: Insufficient documentation

## 2013-03-05 HISTORY — DX: Migraine, unspecified, not intractable, without status migrainosus: G43.909

## 2013-03-05 MED ORDER — METOCLOPRAMIDE HCL 5 MG/ML IJ SOLN
10.0000 mg | Freq: Once | INTRAMUSCULAR | Status: AC
  Administered 2013-03-05: 10 mg via INTRAMUSCULAR
  Filled 2013-03-05: qty 2

## 2013-03-05 MED ORDER — SUMATRIPTAN SUCCINATE 6 MG/0.5ML ~~LOC~~ SOLN
6.0000 mg | Freq: Once | SUBCUTANEOUS | Status: AC
  Administered 2013-03-05: 6 mg via SUBCUTANEOUS
  Filled 2013-03-05: qty 0.5

## 2013-03-05 MED ORDER — DIPHENHYDRAMINE HCL 50 MG/ML IJ SOLN
25.0000 mg | Freq: Once | INTRAMUSCULAR | Status: AC
  Administered 2013-03-05: 25 mg via INTRAMUSCULAR
  Filled 2013-03-05: qty 1

## 2013-03-05 NOTE — ED Notes (Signed)
Driving the car and started to get a migraine headache. Numbness to left side of her body that started shortly after. History of migraines with body numbness as a child.

## 2013-03-05 NOTE — ED Provider Notes (Signed)
History     CSN: [id number]  Arrival date & time 03/05/13  1533   First MD Initiated Contact with Patient 03/05/13 1555      Chief Complaint  Patient presents with  . Migraine    (Consider location/radiation/quality/duration/timing/severity/associated sxs/prior treatment) HPI Comments: Patient presents for migraine headache that started 2 hours ago. Patient states the headache started as a dizzy sensation and blurry vision while she was driving her daughter home from school. It then progressed to an aching sensation behind her left eye that is since progressed to her for head and behind her right eye. She also complains of associated left arm numbness and left sided facial numbness which he states is resolving as well as photophobia. Patient has a history of migraine headaches and is not currently being followed by a neurologist. Patient states her last migraine headache was 10 years ago, but that this headache feels like the ones she has had in the past. Patient denies fever, tinnitus, ear pain or discharge, difficulty swallowing, chest pain, shortness of breath, nausea, vomiting, diarrhea, abdominal pain.  Patient is a 32 y.o. female presenting with migraines. The history is provided by the patient. No language interpreter was used.  Migraine Associated symptoms include headaches and numbness. Pertinent negatives include no abdominal pain, chest pain, chills, fever, nausea, neck pain or vomiting.    Past Medical History  Diagnosis Date  . Hypertension   . Migraine     Past Surgical History  Procedure Laterality Date  . Cholecystectomy      No family history on file.  History  Substance Use Topics  . Smoking status: Current Every Day Smoker  . Smokeless tobacco: Not on file  . Alcohol Use: No    OB History   Grav Para Term Preterm Abortions TAB SAB Ect Mult Living                  Review of Systems  Constitutional: Negative for fever and chills.  HENT: Negative for  neck pain and neck stiffness.   Eyes: Positive for photophobia and visual disturbance.  Respiratory: Negative for shortness of breath.   Cardiovascular: Negative for chest pain.  Gastrointestinal: Negative for nausea, vomiting, abdominal pain and diarrhea.  Skin: Negative for color change.  Neurological: Positive for numbness and headaches. Negative for syncope.  All other systems reviewed and are negative.    Allergies  Review of patient's allergies indicates no known allergies.  Home Medications   Current Outpatient Rx  Name  Route  Sig  Dispense  Refill  . naproxen (NAPROSYN) 500 MG tablet   Oral   Take 1 tablet (500 mg total) by mouth 2 (two) times daily.   14 tablet   0     BP 132/84  Pulse 84  Temp(Src) 98.1 F (36.7 C) (Oral)  Resp 16  Ht 5\' 6"  (1.676 m)  Wt 265 lb (120.203 kg)  BMI 42.79 kg/m2  SpO2 98%  Physical Exam  Nursing note and vitals reviewed. Constitutional: She is oriented to person, place, and time. She appears well-developed and well-nourished. No distress.  HENT:  Head: Normocephalic and atraumatic.  Right Ear: External ear normal.  Left Ear: External ear normal.  Mouth/Throat: Oropharynx is clear and moist. No oropharyngeal exudate.  Symmetric rise of the uvula with phonation. Gross hearing intact.   Eyes: Conjunctivae and EOM are normal. Pupils are equal, round, and reactive to light. No scleral icterus.  Neck: Normal range of motion. Neck supple.  Cardiovascular: Normal rate, regular rhythm, normal heart sounds and intact distal pulses.   Pulmonary/Chest: Effort normal and breath sounds normal. No respiratory distress. She has no wheezes. She has no rales.  Abdominal: Soft. She exhibits no distension. There is no tenderness. There is no rebound and no guarding.  Musculoskeletal: Normal range of motion. She exhibits no edema.  Lymphadenopathy:    She has no cervical adenopathy.  Neurological: She is alert and oriented to person, place, and  time. No cranial nerve deficit. Coordination normal.  No sensory or motor deficits; can differentiate between sharp and dull touch. Strength equal b/l in upper and lower extremities.  Skin: Skin is warm and dry. She is not diaphoretic. No erythema.  Psychiatric: She has a normal mood and affect. Her behavior is normal.    ED Course  Procedures (including critical care time)  Labs Reviewed - No data to display No results found.   1. Migraine      MDM  Patient presents with migraine headache similar in onset and quality to the migraines she has had in the past. Patient neurovascularly intact on exam; no indications for imaging at this time. Tx with imitrex, reglan, and benadryl ordered.  Patient states migraine, dizziness and left upper extremity numbness has completely resolved. Patient to be discharged with neurology followup. She states comfort and understanding with this d/c plan. Patient discussed with Dr. Manus Gunning prior to ED d/c.       Antony Madura, PA-C 03/07/13 2010

## 2013-03-08 NOTE — ED Provider Notes (Signed)
Medical screening examination/treatment/procedure(s) were performed by non-physician practitioner and as supervising physician I was immediately available for consultation/collaboration.   Glynn Octave, MD 03/08/13 [phone]

## 2013-06-03 ENCOUNTER — Encounter (HOSPITAL_BASED_OUTPATIENT_CLINIC_OR_DEPARTMENT_OTHER): Payer: Self-pay | Admitting: *Deleted

## 2013-06-03 ENCOUNTER — Emergency Department (HOSPITAL_BASED_OUTPATIENT_CLINIC_OR_DEPARTMENT_OTHER): Payer: Medicaid Other

## 2013-06-03 ENCOUNTER — Emergency Department (HOSPITAL_BASED_OUTPATIENT_CLINIC_OR_DEPARTMENT_OTHER)
Admission: EM | Admit: 2013-06-03 | Discharge: 2013-06-03 | Disposition: A | Discharge status: Home / Self Care | Payer: Medicaid Other | Attending: Emergency Medicine | Admitting: Emergency Medicine

## 2013-06-03 DIAGNOSIS — Z3202 Encounter for pregnancy test, result negative: Secondary | ICD-10-CM | POA: Insufficient documentation

## 2013-06-03 DIAGNOSIS — Z79899 Other long term (current) drug therapy: Secondary | ICD-10-CM | POA: Insufficient documentation

## 2013-06-03 DIAGNOSIS — F17200 Nicotine dependence, unspecified, uncomplicated: Secondary | ICD-10-CM | POA: Insufficient documentation

## 2013-06-03 DIAGNOSIS — N760 Acute vaginitis: Secondary | ICD-10-CM | POA: Insufficient documentation

## 2013-06-03 DIAGNOSIS — Z8679 Personal history of other diseases of the circulatory system: Secondary | ICD-10-CM | POA: Insufficient documentation

## 2013-06-03 DIAGNOSIS — I10 Essential (primary) hypertension: Secondary | ICD-10-CM | POA: Insufficient documentation

## 2013-06-03 DIAGNOSIS — R1033 Periumbilical pain: Secondary | ICD-10-CM | POA: Insufficient documentation

## 2013-06-03 DIAGNOSIS — R110 Nausea: Secondary | ICD-10-CM | POA: Insufficient documentation

## 2013-06-03 DIAGNOSIS — B9689 Other specified bacterial agents as the cause of diseases classified elsewhere: Secondary | ICD-10-CM

## 2013-06-03 DIAGNOSIS — R109 Unspecified abdominal pain: Secondary | ICD-10-CM

## 2013-06-03 DIAGNOSIS — Z9089 Acquired absence of other organs: Secondary | ICD-10-CM | POA: Insufficient documentation

## 2013-06-03 DIAGNOSIS — Z791 Long term (current) use of non-steroidal anti-inflammatories (NSAID): Secondary | ICD-10-CM | POA: Insufficient documentation

## 2013-06-03 LAB — COMPREHENSIVE METABOLIC PANEL
BUN: 16 mg/dL (ref 6–23)
CO2: 21 mEq/L (ref 19–32)
Calcium: 9.7 mg/dL (ref 8.4–10.5)
Creatinine, Ser: 0.8 mg/dL (ref 0.50–1.10)
GFR calc Af Amer: >90 mL/min (ref >90)
GFR calc non Af Amer: >90 mL/min (ref >90)
Glucose, Bld: 97 mg/dL (ref 70–99)

## 2013-06-03 LAB — CBC WITH DIFFERENTIAL/PLATELET
Basophils Absolute: 0 10*3/uL (ref 0.0–0.1)
Eosinophils Relative: 2 % (ref 0–5)
HCT: 43.3 % (ref 36.0–46.0)
Lymphocytes Relative: 18 % (ref 12–46)
Lymphs Abs: 3 10*3/uL (ref 0.7–4.0)
MCV: 84.9 fL (ref 78.0–100.0)
Monocytes Absolute: 0.8 10*3/uL (ref 0.1–1.0)
RDW: 13.4 % (ref 11.5–15.5)
WBC: 16.4 10*3/uL — ABNORMAL HIGH (ref 4.0–10.5)

## 2013-06-03 LAB — PREGNANCY, URINE: Preg Test, Ur: NEGATIVE

## 2013-06-03 LAB — URINALYSIS, ROUTINE W REFLEX MICROSCOPIC
Hgb urine dipstick: NEGATIVE
Protein, ur: NEGATIVE mg/dL
Urobilinogen, UA: 0.2 mg/dL (ref 0.0–1.0)

## 2013-06-03 LAB — LIPASE, BLOOD: Lipase: 17 U/L (ref 11–59)

## 2013-06-03 MED ORDER — IOHEXOL 300 MG/ML  SOLN
100.0000 mL | Freq: Once | INTRAMUSCULAR | Status: AC | PRN
  Administered 2013-06-03: 100 mL via INTRAVENOUS

## 2013-06-03 MED ORDER — METRONIDAZOLE 500 MG PO TABS: 500.0000 mg | ORAL_TABLET | Freq: Two times a day (BID) | ORAL | Status: DC

## 2013-06-03 MED ORDER — FLUCONAZOLE 100 MG PO TABS: 100.0000 mg | ORAL_TABLET | Freq: Every day | ORAL | Status: AC

## 2013-06-03 MED ORDER — IOHEXOL 300 MG/ML  SOLN
50.0000 mL | Freq: Once | INTRAMUSCULAR | Status: AC | PRN
  Administered 2013-06-03: 50 mL via ORAL

## 2013-06-03 MED ORDER — HYDROCODONE-ACETAMINOPHEN 5-325 MG PO TABS: 1.0000 | ORAL_TABLET | ORAL | Status: DC | PRN

## 2013-06-03 NOTE — ED Notes (Signed)
Mid abd pain 2 hours ago. No vomiting no diarrhea. No hx of same.

## 2013-06-03 NOTE — ED Provider Notes (Signed)
Medical screening examination/treatment/procedure(s) were performed by non-physician practitioner and as supervising physician I was immediately available for consultation/collaboration.   Charles B. Bernette Mayers, MD 06/03/13 2100

## 2013-06-03 NOTE — ED Provider Notes (Signed)
History     CSN: [id number]  Arrival date & time 06/03/13  1727   First MD Initiated Contact with Patient 06/03/13 1749      Chief Complaint  Patient presents with  . Abdominal Pain    (Consider location/radiation/quality/duration/timing/severity/associated sxs/prior treatment) HPI Comments: Pt c/o periumbilical pain  Patient is a 32 y.o. female presenting with abdominal pain. The history is provided by the patient. No language interpreter was used.  Abdominal Pain This is a new problem. The current episode started today. The problem occurs constantly. The problem has been unchanged. Associated symptoms include abdominal pain and nausea. Pertinent negatives include no fever, urinary symptoms or vomiting. Nothing aggravates the symptoms. She has tried nothing for the symptoms. The treatment provided no relief.    Past Medical History  Diagnosis Date  . Hypertension   . Migraine     Past Surgical History  Procedure Laterality Date  . Cholecystectomy      No family history on file.  History  Substance Use Topics  . Smoking status: Current Every Day Smoker -- 0.50 packs/day    Types: Cigarettes  . Smokeless tobacco: Not on file  . Alcohol Use: No    OB History   Grav Para Term Preterm Abortions TAB SAB Ect Mult Living                  Review of Systems  Constitutional: Negative for fever.  Respiratory: Negative.   Cardiovascular: Negative.   Gastrointestinal: Positive for nausea and abdominal pain. Negative for vomiting.    Allergies  Review of patient's allergies indicates no known allergies.  Home Medications   Current Outpatient Rx  Name  Route  Sig  Dispense  Refill  . Citalopram Hydrobromide (CELEXA PO)   Oral   Take by mouth.         Marland Kitchen LISINOPRIL PO   Oral   Take by mouth.         . topiramate (TOPAMAX) 50 MG tablet   Oral   Take 50 mg by mouth 2 (two) times daily.         Marland Kitchen VERAPAMIL HCL PO   Oral   Take by mouth.         .  naproxen (NAPROSYN) 500 MG tablet   Oral   Take 1 tablet (500 mg total) by mouth 2 (two) times daily.   14 tablet   0     BP 155/93  Pulse 116  Temp(Src) 99.1 F (37.3 C) (Oral)  Resp 16  Ht 5\' 6"  (1.676 m)  Wt 262 lb (118.842 kg)  BMI 42.31 kg/m2  SpO2 98%  LMP 05/27/2013  Physical Exam  Nursing note and vitals reviewed. Constitutional: She is oriented to person, place, and time. She appears well-developed and well-nourished.  Eyes: Conjunctivae and EOM are normal.  Neck: Normal range of motion. Neck supple.  Cardiovascular: Normal rate and regular rhythm.   Pulmonary/Chest: Effort normal and breath sounds normal.  Abdominal: Soft.  Periumbilical tenderness  Musculoskeletal: Normal range of motion.  Neurological: She is alert and oriented to person, place, and time.  Skin: Skin is warm and dry.  Psychiatric: She has a normal mood and affect.    ED Course  Procedures (including critical care time)  Labs Reviewed  URINALYSIS, ROUTINE W REFLEX MICROSCOPIC - Abnormal; Notable for the following:    Leukocytes, UA TRACE (*)    All other components within normal limits  CBC WITH DIFFERENTIAL - Abnormal; Notable  for the following:    WBC 16.4 (*)    Hemoglobin 15.2 (*)    Neutro Abs 12.2 (*)    All other components within normal limits  COMPREHENSIVE METABOLIC PANEL - Abnormal; Notable for the following:    Total Bilirubin 0.2 (*)    All other components within normal limits  GC/CHLAMYDIA PROBE AMP  WET PREP, GENITAL  PREGNANCY, URINE  URINE MICROSCOPIC-ADD ON  LIPASE, BLOOD   Ct Abdomen Pelvis W Contrast  06/03/2013   *RADIOLOGY REPORT*  Clinical Data: Periumbilical abdominal pain.  Prior cholecystectomy.  CT ABDOMEN AND PELVIS WITH CONTRAST  Technique:  Multidetector CT imaging of the abdomen and pelvis was performed following the standard protocol during bolus administration of intravenous contrast.  Contrast:  OMNIPAQUE IOHEXOL 300 MG/ML  SOLN  Comparison:  09/23/2010  Findings: Prior cholecystectomy again noted.  No evidence of biliary dilatation.  The liver, pancreas, spleen, adrenal glands, and kidneys are normal in appearance.  No evidence of hydronephrosis.  Uterus and adnexae are unremarkable in appearance.  No soft tissue masses or lymphadenopathy identified.  No evidence of inflammatory process or abnormal fluid collections.  No evidence of bowel wall thickening, dilatation, or hernia.  Incidentally noted are bilateral pars defects at L5 with moderate degenerative disc disease at L5-S1.  No evidence of spondylolisthesis.  IMPRESSION:  1.  Stable exam.  No acute findings. 2.  Incidentally noted are bilateral L5 pars defects and L5-S1 degenerative disc disease.   Original Report Authenticated By: Myles Rosenthal, M.D.     1. Abdominal pain   2. BV (bacterial vaginosis)       MDM  No acute abdominal process noted:will treat for bv and with vicodin for pain:pt is comfortable at this time        Teressa Lower, NP 06/03/13 2054

## 2013-06-04 LAB — GC/CHLAMYDIA PROBE AMP
CT Probe RNA: NEGATIVE
GC Probe RNA: NEGATIVE

## 2013-09-23 ENCOUNTER — Encounter (HOSPITAL_BASED_OUTPATIENT_CLINIC_OR_DEPARTMENT_OTHER): Payer: Self-pay

## 2013-09-23 ENCOUNTER — Emergency Department (HOSPITAL_BASED_OUTPATIENT_CLINIC_OR_DEPARTMENT_OTHER)
Admission: EM | Admit: 2013-09-23 | Discharge: 2013-09-23 | Disposition: A | Discharge status: Home / Self Care | Payer: Medicaid Other | Attending: Emergency Medicine | Admitting: Emergency Medicine

## 2013-09-23 DIAGNOSIS — L089 Local infection of the skin and subcutaneous tissue, unspecified: Secondary | ICD-10-CM

## 2013-09-23 DIAGNOSIS — Z792 Long term (current) use of antibiotics: Secondary | ICD-10-CM | POA: Insufficient documentation

## 2013-09-23 DIAGNOSIS — F17200 Nicotine dependence, unspecified, uncomplicated: Secondary | ICD-10-CM | POA: Insufficient documentation

## 2013-09-23 DIAGNOSIS — L723 Sebaceous cyst: Secondary | ICD-10-CM | POA: Insufficient documentation

## 2013-09-23 DIAGNOSIS — G43909 Migraine, unspecified, not intractable, without status migrainosus: Secondary | ICD-10-CM | POA: Insufficient documentation

## 2013-09-23 DIAGNOSIS — Z79899 Other long term (current) drug therapy: Secondary | ICD-10-CM | POA: Insufficient documentation

## 2013-09-23 DIAGNOSIS — I10 Essential (primary) hypertension: Secondary | ICD-10-CM | POA: Insufficient documentation

## 2013-09-23 MED ORDER — LIDOCAINE HCL (PF) 1 % IJ SOLN
5.0000 mL | Freq: Once | INTRAMUSCULAR | Status: AC
  Administered 2013-09-23: 5 mL via INTRADERMAL
  Filled 2013-09-23: qty 5

## 2013-09-23 MED ORDER — SULFAMETHOXAZOLE-TRIMETHOPRIM 800-160 MG PO TABS: 1.0000 | ORAL_TABLET | Freq: Two times a day (BID) | ORAL | Status: DC

## 2013-09-23 MED ORDER — HYDROCODONE-ACETAMINOPHEN 5-325 MG PO TABS: 1.0000 | ORAL_TABLET | ORAL | Status: DC | PRN

## 2013-09-23 NOTE — ED Notes (Signed)
Abscess noted to right side of neck - pt reports area has been there for 9 years, but has worsened over the last week - area is red, inflamed and painful, 8/10.

## 2013-09-23 NOTE — ED Provider Notes (Signed)
TIME SEEN: 9:20 AM  CHIEF COMPLAINT: Infected sebaceous cyst  HPI: Patient is a 32 y.o. female with a history of hypertension, migraines who presents to the emergency department with one week of increased swelling, redness and moderate, sharp pain without radiation to a sebaceous cyst that has been on the base of the right lateral neck or 9 years. She states she's been seen by her doctor who told her that this was nothing to worry about unless it became infected. She states she's had subjective fevers but no chills. No vomiting or diarrhea. No difficulty swallowing or moving her neck. No changes in her voice. States the pain is worse with palpation of the cyst. Denies any alleviating factors. She denies any drainage from the cyst.  ROS: See HPI Constitutional: Subjective fever  Eyes: no drainage  ENT: no runny nose   Cardiovascular:  no chest pain  Resp: no SOB  GI: no vomiting GU: no dysuria Integumentary: no rash  Allergy: no hives  Musculoskeletal: no leg swelling  Neurological: no slurred speech ROS otherwise negative  PAST MEDICAL HISTORY/PAST SURGICAL HISTORY:  Past Medical History  Diagnosis Date  . Hypertension   . Migraine     MEDICATIONS:  Prior to Admission medications   Medication Sig Start Date End Date Taking? Authorizing Provider  Citalopram Hydrobromide (CELEXA PO) Take 20 mg by mouth.    Yes Historical Provider, MD  LISINOPRIL PO Take 25 mg by mouth.    Yes Historical Provider, MD  topiramate (TOPAMAX) 50 MG tablet Take 50 mg by mouth 2 (two) times daily.   Yes Historical Provider, MD  VERAPAMIL HCL PO Take 25 mg by mouth.    Yes Historical Provider, MD  HYDROcodone-acetaminophen (NORCO/VICODIN) 5-325 MG per tablet Take 1 tablet by mouth every 4 (four) hours as needed for pain. 06/03/13   Teressa Lower, NP  metroNIDAZOLE (FLAGYL) 500 MG tablet Take 1 tablet (500 mg total) by mouth 2 (two) times daily. 06/03/13   Teressa Lower, NP    ALLERGIES:  No Known  Allergies  SOCIAL HISTORY:  History  Substance Use Topics  . Smoking status: Current Every Day Smoker -- 0.50 packs/day    Types: Cigarettes  . Smokeless tobacco: Not on file  . Alcohol Use: No    FAMILY HISTORY: History reviewed. No pertinent family history.  EXAM: BP 150/83  Pulse 96  Temp(Src) 98.7 F (37.1 C) (Oral)  Resp 16  Ht 5\' 6"  (1.676 m)  Wt 260 lb (117.935 kg)  BMI 41.99 kg/m2  SpO2 100% CONSTITUTIONAL: Alert and oriented and responds appropriately to questions. Well-appearing; well-nourished HEAD: Normocephalic EYES: Conjunctivae clear, PERRL ENT: normal nose; no rhinorrhea; moist mucous membranes; pharynx without lesions noted;  3 x 4 cm fluctuant area consistent with sebaceous cyst with surrounding 1-2 cm of cellulitis, no drainage, no induration, no cervical lymphadenopathy, no pain with movement of the neck NECK: Supple, no meningismus, no LAD  CARD: RRR; S1 and S2 appreciated; no murmurs, no clicks, no rubs, no gallops RESP: Normal chest excursion without splinting or tachypnea; breath sounds clear and equal bilaterally; no wheezes, no rhonchi, no rales,  ABD/GI: Normal bowel sounds; non-distended; soft, non-tender, no rebound, no guarding BACK:  The back appears normal and is non-tender to palpation, there is no CVA tenderness EXT: Normal ROM in all joints; non-tender to palpation; no edema; normal capillary refill; no cyanosis    SKIN: Normal color for age and race; warm NEURO: Moves all extremities equally PSYCH: The patient's  mood and manner are appropriate. Grooming and personal hygiene are appropriate.  MEDICAL DECISION MAKING: Patient with superficial infected sebaceous cyst. She is hemodynamically stable, nontoxic appearing. Will incise and drain and started on antibiotics given her surrounding cellulitis. Have instructed patient that she will need to followup with her dermatologist or surgeon for further intervention as her cyst may come back. She  verbalized understanding and is comfortable at this plan.  ED PROGRESS:   INCISION AND DRAINAGE Performed by: Raelyn Number Consent: Verbal consent obtained. Risks and benefits: risks, benefits and alternatives were discussed Type: abscess  Body area: Right neck  Anesthesia: local infiltration  Incision was made with a scalpel.  Local anesthetic: lidocaine 1 % without epinephrine  Anesthetic total: 5 ml  Complexity: complex Blunt dissection to break up loculations  Drainage: purulent and sebaceous   Drainage amount: 10 mL   Sterile gauze placed and covered with Tegaderm.   Patient tolerance: Patient tolerated the procedure well with no immediate complications.     Layla Maw Evette Diclemente, DO 09/23/13 1001

## 2013-12-23 ENCOUNTER — Encounter (HOSPITAL_BASED_OUTPATIENT_CLINIC_OR_DEPARTMENT_OTHER): Payer: Self-pay | Admitting: Emergency Medicine

## 2013-12-23 ENCOUNTER — Emergency Department (HOSPITAL_BASED_OUTPATIENT_CLINIC_OR_DEPARTMENT_OTHER)
Admission: EM | Admit: 2013-12-23 | Discharge: 2013-12-23 | Disposition: A | Discharge status: Home / Self Care | Payer: Medicaid Other | Attending: Emergency Medicine | Admitting: Emergency Medicine

## 2013-12-23 DIAGNOSIS — F17200 Nicotine dependence, unspecified, uncomplicated: Secondary | ICD-10-CM | POA: Insufficient documentation

## 2013-12-23 DIAGNOSIS — M545 Low back pain, unspecified: Secondary | ICD-10-CM | POA: Insufficient documentation

## 2013-12-23 DIAGNOSIS — R350 Frequency of micturition: Secondary | ICD-10-CM | POA: Insufficient documentation

## 2013-12-23 DIAGNOSIS — Z79899 Other long term (current) drug therapy: Secondary | ICD-10-CM | POA: Insufficient documentation

## 2013-12-23 DIAGNOSIS — Z3202 Encounter for pregnancy test, result negative: Secondary | ICD-10-CM | POA: Insufficient documentation

## 2013-12-23 DIAGNOSIS — G43909 Migraine, unspecified, not intractable, without status migrainosus: Secondary | ICD-10-CM | POA: Insufficient documentation

## 2013-12-23 DIAGNOSIS — I10 Essential (primary) hypertension: Secondary | ICD-10-CM | POA: Insufficient documentation

## 2013-12-23 DIAGNOSIS — Z791 Long term (current) use of non-steroidal anti-inflammatories (NSAID): Secondary | ICD-10-CM | POA: Insufficient documentation

## 2013-12-23 DIAGNOSIS — M549 Dorsalgia, unspecified: Secondary | ICD-10-CM

## 2013-12-23 LAB — URINALYSIS, ROUTINE W REFLEX MICROSCOPIC
Glucose, UA: NEGATIVE mg/dL
Ketones, ur: NEGATIVE mg/dL
Leukocytes, UA: NEGATIVE
Nitrite: NEGATIVE
Protein, ur: NEGATIVE mg/dL
Specific Gravity, Urine: 1.027 (ref 1.005–1.030)
pH: 6 (ref 5.0–8.0)

## 2013-12-23 LAB — PREGNANCY, URINE: Preg Test, Ur: NEGATIVE

## 2013-12-23 LAB — URINE MICROSCOPIC-ADD ON

## 2013-12-23 MED ORDER — CYCLOBENZAPRINE HCL 10 MG PO TABS: 10.0000 mg | ORAL_TABLET | Freq: Two times a day (BID) | ORAL | Status: DC | PRN

## 2013-12-23 MED ORDER — IBUPROFEN 800 MG PO TABS: 800.0000 mg | ORAL_TABLET | Freq: Three times a day (TID) | ORAL | Status: DC

## 2013-12-23 NOTE — ED Provider Notes (Signed)
Medical screening examination/treatment/procedure(s) were performed by non-physician practitioner and as supervising physician I was immediately available for consultation/collaboration.  EKG Interpretation   None         Holdyn Poyser B. Bernette Mayers, MD 12/23/13 [phone]

## 2013-12-23 NOTE — ED Notes (Signed)
Pt reports back pain and urinary frequency since Friday.

## 2013-12-23 NOTE — ED Provider Notes (Signed)
CSN: [id number]     Arrival date & time 12/23/13  1117 History   First MD Initiated Contact with Patient 12/23/13 1158     Chief Complaint  Patient presents with  . Back Pain  . Urinary Frequency   (Consider location/radiation/quality/duration/timing/severity/associated sxs/prior Treatment) Patient is a 32 y.o. female presenting with back pain and frequency. The history is provided by the patient. No language interpreter was used.  Back Pain Location:  Lumbar spine Quality:  Aching Radiates to:  Does not radiate Pain severity:  Moderate Onset quality:  Gradual Duration:  4 days Associated symptoms: no abdominal pain, no dysuria, no fever, no headaches and no numbness   Associated symptoms comment:  Low back pain without known injury for 4 days. Worse with movement, better with rest. She has had some urinary frequency without dysuria or hematuria. No fever, nausea or vomiting. NO change in appetite. Urinary Frequency Pertinent negatives include no abdominal pain, chills, fever, headaches, nausea, numbness or vomiting.    Past Medical History  Diagnosis Date  . Hypertension   . Migraine    Past Surgical History  Procedure Laterality Date  . Cholecystectomy     No family history on file. History  Substance Use Topics  . Smoking status: Current Every Day Smoker -- 0.50 packs/day    Types: Cigarettes  . Smokeless tobacco: Not on file  . Alcohol Use: No   OB History   Grav Para Term Preterm Abortions TAB SAB Ect Mult Living                 Review of Systems  Constitutional: Negative for fever and chills.  Gastrointestinal: Negative.  Negative for nausea, vomiting and abdominal pain.  Genitourinary: Positive for frequency. Negative for dysuria and hematuria.  Musculoskeletal: Positive for back pain.  Skin: Negative.   Neurological: Negative.  Negative for numbness and headaches.    Allergies  Bactrim  Home Medications   Current Outpatient Rx  Name  Route  Sig   Dispense  Refill  . Citalopram Hydrobromide (CELEXA PO)   Oral   Take 20 mg by mouth.          . cyclobenzaprine (FLEXERIL) 10 MG tablet   Oral   Take 1 tablet (10 mg total) by mouth 2 (two) times daily as needed for muscle spasms.   20 tablet   0   . HYDROcodone-acetaminophen (NORCO/VICODIN) 5-325 MG per tablet   Oral   Take 1 tablet by mouth every 4 (four) hours as needed for pain.   10 tablet   0   . HYDROcodone-acetaminophen (NORCO/VICODIN) 5-325 MG per tablet   Oral   Take 1 tablet by mouth every 4 (four) hours as needed for pain.   15 tablet   0   . ibuprofen (ADVIL,MOTRIN) 800 MG tablet   Oral   Take 1 tablet (800 mg total) by mouth 3 (three) times daily.   21 tablet   0   . LISINOPRIL PO   Oral   Take 25 mg by mouth.          . metroNIDAZOLE (FLAGYL) 500 MG tablet   Oral   Take 1 tablet (500 mg total) by mouth 2 (two) times daily.   14 tablet   0   . sulfamethoxazole-trimethoprim (SEPTRA DS) 800-160 MG per tablet   Oral   Take 1 tablet by mouth every 12 (twelve) hours.   14 tablet   0   . topiramate (TOPAMAX) 50 MG tablet  Oral   Take 50 mg by mouth 2 (two) times daily.         Marland Kitchen VERAPAMIL HCL PO   Oral   Take 25 mg by mouth.           BP 126/76  Pulse 89  Temp(Src) 98.5 F (36.9 C) (Oral)  Resp 16  Ht 5\' 6"  (1.676 m)  Wt 262 lb (118.842 kg)  BMI 42.31 kg/m2  SpO2 99%  LMP 12/22/2013 Physical Exam  Constitutional: She is oriented to person, place, and time. She appears well-developed and well-nourished.  HENT:  Head: Normocephalic.  Neck: Normal range of motion. Neck supple.  Cardiovascular: Normal rate and regular rhythm.   Pulmonary/Chest: Effort normal and breath sounds normal.  Abdominal: Soft. Bowel sounds are normal. There is no tenderness. There is no rebound and no guarding.  Musculoskeletal: Normal range of motion.  Mild lumbar and paralumbar tenderness without swelling or discoloration.  Neurological: She is alert  and oriented to person, place, and time. She has normal reflexes. Coordination normal.  Skin: Skin is warm and dry. No rash noted.  Psychiatric: She has a normal mood and affect.    ED Course  Procedures (including critical care time) Labs Review Labs Reviewed  URINALYSIS, ROUTINE W REFLEX MICROSCOPIC - Abnormal; Notable for the following:    Hgb urine dipstick MODERATE (*)    All other components within normal limits  URINE MICROSCOPIC-ADD ON - Abnormal; Notable for the following:    Squamous Epithelial / LPF FEW (*)    Bacteria, UA FEW (*)    All other components within normal limits  PREGNANCY, URINE   Imaging Review No results found.  EKG Interpretation   None       MDM   1. Back pain    Lower back pain following muscular pattern with negative UA.     Arnoldo Hooker, PA-C 12/23/13 4098

## 2014-12-31 ENCOUNTER — Emergency Department (HOSPITAL_BASED_OUTPATIENT_CLINIC_OR_DEPARTMENT_OTHER)
Admission: EM | Admit: 2014-12-31 | Discharge: 2014-12-31 | Disposition: A | Discharge status: Home / Self Care | Payer: Medicaid Other | Attending: Emergency Medicine | Admitting: Emergency Medicine

## 2014-12-31 ENCOUNTER — Encounter (HOSPITAL_BASED_OUTPATIENT_CLINIC_OR_DEPARTMENT_OTHER): Payer: Self-pay | Admitting: *Deleted

## 2014-12-31 DIAGNOSIS — I10 Essential (primary) hypertension: Secondary | ICD-10-CM | POA: Insufficient documentation

## 2014-12-31 DIAGNOSIS — Z792 Long term (current) use of antibiotics: Secondary | ICD-10-CM | POA: Insufficient documentation

## 2014-12-31 DIAGNOSIS — G43909 Migraine, unspecified, not intractable, without status migrainosus: Secondary | ICD-10-CM | POA: Diagnosis not present

## 2014-12-31 DIAGNOSIS — Z720 Tobacco use: Secondary | ICD-10-CM | POA: Insufficient documentation

## 2014-12-31 DIAGNOSIS — Z79899 Other long term (current) drug therapy: Secondary | ICD-10-CM | POA: Diagnosis not present

## 2014-12-31 DIAGNOSIS — L02414 Cutaneous abscess of left upper limb: Secondary | ICD-10-CM | POA: Diagnosis present

## 2014-12-31 DIAGNOSIS — Z791 Long term (current) use of non-steroidal anti-inflammatories (NSAID): Secondary | ICD-10-CM | POA: Diagnosis not present

## 2014-12-31 DIAGNOSIS — E079 Disorder of thyroid, unspecified: Secondary | ICD-10-CM | POA: Diagnosis not present

## 2014-12-31 HISTORY — DX: Disorder of thyroid, unspecified: E07.9

## 2014-12-31 MED ORDER — IBUPROFEN 800 MG PO TABS: 800.0000 mg | ORAL_TABLET | Freq: Three times a day (TID) | ORAL | Status: DC

## 2014-12-31 MED ORDER — DOXYCYCLINE HYCLATE 100 MG PO CAPS: 100.0000 mg | ORAL_CAPSULE | Freq: Two times a day (BID) | ORAL | Status: DC

## 2014-12-31 MED ORDER — LIDOCAINE HCL (PF) 1 % IJ SOLN
2.0000 mL | Freq: Once | INTRAMUSCULAR | Status: AC
  Administered 2014-12-31: 2 mL
  Filled 2014-12-31: qty 5

## 2014-12-31 NOTE — ED Provider Notes (Signed)
CSN: [id number]     Arrival date & time 12/31/14  [id number] History   First MD Initiated Contact with Patient 12/31/14 [phone]     Chief Complaint  Patient presents with  . Wound Check     (Consider location/radiation/quality/duration/timing/severity/associated sxs/prior Treatment) HPI The patient states she developed a small "blister" on her left forearm a week ago. She reports that it popped and drained a little bit of her purulent looking drainage yesterday and then became very red. Today the redness has spread. She reports is moderately painful. sHe denies any fever or generalized illness or chills. She reports she had an abscess once before several years ago. Past Medical History  Diagnosis Date  . Hypertension   . Migraine   . Thyroid disease    Past Surgical History  Procedure Laterality Date  . Cholecystectomy     History reviewed. No pertinent family history. History  Substance Use Topics  . Smoking status: Current Every Day Smoker -- 0.50 packs/day    Types: Cigarettes  . Smokeless tobacco: Not on file  . Alcohol Use: No   OB History    No data available     Review of Systems Constitutional: No fever no chills or malaise GI: No abdominal pain nausea or vomiting.   Allergies  Bactrim  Home Medications   Prior to Admission medications   Medication Sig Start Date End Date Taking? Authorizing Provider  LEVOTHYROXINE SODIUM PO Take by mouth.   Yes Historical Provider, MD  Citalopram Hydrobromide (CELEXA PO) Take 20 mg by mouth.     Historical Provider, MD  cyclobenzaprine (FLEXERIL) 10 MG tablet Take 1 tablet (10 mg total) by mouth 2 (two) times daily as needed for muscle spasms. 12/23/13   Shari A Upstill, PA-C  doxycycline (VIBRAMYCIN) 100 MG capsule Take 1 capsule (100 mg total) by mouth 2 (two) times daily. One po bid x 7 days 12/31/14   Arby BarretteMarcy Cannie Muckle, MD  HYDROcodone-acetaminophen (NORCO/VICODIN) 5-325 MG per tablet Take 1 tablet by mouth every 4 (four) hours as  needed for pain. 06/03/13   Teressa LowerVrinda Pickering, NP  HYDROcodone-acetaminophen (NORCO/VICODIN) 5-325 MG per tablet Take 1 tablet by mouth every 4 (four) hours as needed for pain. 09/23/13   Kristen N Ward, DO  ibuprofen (ADVIL,MOTRIN) 800 MG tablet Take 1 tablet (800 mg total) by mouth 3 (three) times daily. 12/23/13   Shari A Upstill, PA-C  ibuprofen (ADVIL,MOTRIN) 800 MG tablet Take 1 tablet (800 mg total) by mouth 3 (three) times daily. 12/31/14   Arby BarretteMarcy Jermayne Sweeney, MD  LISINOPRIL PO Take 25 mg by mouth.     Historical Provider, MD  metroNIDAZOLE (FLAGYL) 500 MG tablet Take 1 tablet (500 mg total) by mouth 2 (two) times daily. 06/03/13   Teressa LowerVrinda Pickering, NP  sulfamethoxazole-trimethoprim (SEPTRA DS) 800-160 MG per tablet Take 1 tablet by mouth every 12 (twelve) hours. 09/23/13   Kristen N Ward, DO  topiramate (TOPAMAX) 50 MG tablet Take 50 mg by mouth 2 (two) times daily.    Historical Provider, MD  VERAPAMIL HCL PO Take 25 mg by mouth.     Historical Provider, MD   BP 137/94 mmHg  Pulse 113  Temp(Src) 98.1 F (36.7 C) (Oral)  Ht 5\' 6"  (1.676 m)  Wt 267 lb (121.11 kg)  BMI 43.12 kg/m2  SpO2 96%  LMP 12/26/2014 Physical Exam  Constitutional: She appears well-developed and well-nourished. No distress.  HENT:  Head: Normocephalic and atraumatic.  Eyes: EOM are normal.  Pulmonary/Chest: Effort normal.  Musculoskeletal:  Patient has focal erythematous plaque on her left forearm with a central small pustule. Small amount of fluctuance present. See attached image. The hand and upper arm are normal. There is no streaking or generalized edema of the hand or the arm.  Neurological: She is alert. Coordination normal.  Skin: Skin is warm and dry.  Psychiatric: She has a normal mood and affect.      ED Course  INCISION AND DRAINAGE Date/Time: 12/31/2014 11:06 AM Performed by: Arby Barrette Authorized by: Arby Barrette Consent: Verbal consent obtained. Consent given by: patient Type:  abscess Body area: upper extremity Location details: left arm Anesthesia: local infiltration Local anesthetic: lidocaine 1% without epinephrine Anesthetic total: 1 ml Patient sedated: no Scalpel size: 11 Incision type: single straight Complexity: simple Drainage: purulent Drainage amount: moderate Packing material: 1/4 in gauze Patient tolerance: Patient tolerated the procedure well with no immediate complications   (including critical care time) Labs Review Labs Reviewed - No data to display  Imaging Review No results found.   EKG Interpretation None       MDM   Final diagnoses:  Abscess of forearm, left   The patient presents on above with an isolated area of abscess with surrounding cellulitis. The abscess was drained and packed. She does have Bactrim allergy and will be started on doxycycline. Patient's counseled to return if there is increasing pain, redness fever or other concerning symptoms.    Arby Barrette, MD 12/31/14 [phone]

## 2014-12-31 NOTE — ED Notes (Signed)
Pt amb to room 7 with quick steady gait in nad. Pt reports 2 weeks ago noticed a sore inside her right nares. Pt states she treated with neosporin and it decreased, but is still present. Pt reports noticing a "blister" to left posterior forearm one week ago, now pt has surrounding redness, small amt drainage noted. Denies any fevers or other c/o.

## 2014-12-31 NOTE — Discharge Instructions (Signed)
Abscess °An abscess is an infected area that contains a collection of pus and debris. It can occur in almost any part of the body. An abscess is also known as a furuncle or boil. °CAUSES  °An abscess occurs when tissue gets infected. This can occur from blockage of oil or sweat glands, infection of hair follicles, or a minor injury to the skin. As the body tries to fight the infection, pus collects in the area and creates pressure under the skin. This pressure causes pain. People with weakened immune systems have difficulty fighting infections and get certain abscesses more often.  °SYMPTOMS °Usually an abscess develops on the skin and becomes a painful mass that is red, warm, and tender. If the abscess forms under the skin, you may feel a moveable soft area under the skin. Some abscesses break open (rupture) on their own, but most will continue to get worse without care. The infection can spread deeper into the body and eventually into the bloodstream, causing you to feel ill.  °DIAGNOSIS  °Your caregiver will take your medical history and perform a physical exam. A sample of fluid may also be taken from the abscess to determine what is causing your infection. °TREATMENT  °Your caregiver may prescribe antibiotic medicines to fight the infection. However, taking antibiotics alone usually does not cure an abscess. Your caregiver may need to make a small cut (incision) in the abscess to drain the pus. In some cases, gauze is packed into the abscess to reduce pain and to continue draining the area. °HOME CARE INSTRUCTIONS  °· Only take over-the-counter or prescription medicines for pain, discomfort, or fever as directed by your caregiver. °· If you were prescribed antibiotics, take them as directed. Finish them even if you start to feel better. °· If gauze is used, follow your caregiver's directions for changing the gauze. °· To avoid spreading the infection: °¨ Keep your draining abscess covered with a  bandage. °¨ Wash your hands well. °¨ Do not share personal care items, towels, or whirlpools with others. °¨ Avoid skin contact with others. °· Keep your skin and clothes clean around the abscess. °· Keep all follow-up appointments as directed by your caregiver. °SEEK MEDICAL CARE IF:  °· You have increased pain, swelling, redness, fluid drainage, or bleeding. °· You have muscle aches, chills, or a general ill feeling. °· You have a fever. °MAKE SURE YOU:  °· Understand these instructions. °· Will watch your condition. °· Will get help right away if you are not doing well or get worse. °Document Released: 09/20/2005 Document Revised: 06/11/2012 Document Reviewed: 02/23/2012 °ExitCare® Patient Information ©2015 [employer]. This information is not intended to replace advice given to you by your health care provider. Make sure you discuss any questions you have with your health care provider. ° ° °Emergency Department Resource Guide °1) Find a Doctor and Pay Out of Pocket °Although you won't have to find out who is covered by your insurance plan, it is a good idea to ask around and get recommendations. You will then need to call the office and see if the doctor you have chosen will accept you as a new patient and what types of options they offer for patients who are self-pay. Some doctors offer discounts or will set up payment plans for their patients who do not have insurance, but you will need to ask so you aren't surprised when you get to your appointment. ° °2) Contact Your Local Health Department °Not all health departments   have doctors that can see patients for sick visits, but many do, so it is worth a call to see if yours does. If you don't know where your local health department is, you can check in your phone book. The CDC also has a tool to help you locate your state's health department, and many state websites also have listings of all of their local health departments. ° °3) Find a Walk-in Clinic °If  your illness is not likely to be very severe or complicated, you may want to try a walk in clinic. These are popping up all over the country in pharmacies, drugstores, and shopping centers. They're usually staffed by nurse practitioners or physician assistants that have been trained to treat common illnesses and complaints. They're usually fairly quick and inexpensive. However, if you have serious medical issues or chronic medical problems, these are probably not your best option. ° °No Primary Care Doctor: °- Call [facility] at  832-8000 - they can help you locate a primary care doctor that  accepts your insurance, provides certain services, etc. °- Physician Referral Service- 1-800-533-3463 ° °Chronic Pain Problems: °Organization         Address  Phone   Notes  °[facility]  (336) 297-2271 Patients need to be referred by their primary care doctor.  ° °Medication Assistance: °Organization         Address  Phone   Notes  °[place] [facility] [address] °[place], [place] [address] (336) 641-8030 --Must be a resident of [place] °-- Must have NO insurance coverage whatsoever (no Medicaid/ Medicare, etc.) °-- The pt. MUST have a primary care doctor that directs their care regularly and follows them in the community °  °[facility]  (866) 331-1348   °[employer]  (888) 892-1162   ° °Agencies that provide inexpensive medical care: °Organization         Address  Phone   Notes  °[facility]  (336) 832-8035   °[facility]    (336) 832-7272   °[facility] [address] °[place], [place] [address] (336) 832-4777   °[facility] [address], °[place] (336) 271-4999   °[facility]    (336) 373-0678   °[facility]    (336) 272-1050   °[facility] ° [address], [place] Phone:  (336) 832-4444, Fax:  (336) 832-4440 Hours of  Operation:  9 am - 6 pm, M-F.  Also accepts Medicaid/Medicare and self-pay.  °[facility] ° [address], [place] Phone: (336) 832-3150, Fax: (336) 832-3151. Hours of Operation:  8:30 am - 5:30 pm, M-F.  Also accepts Medicaid and self-pay.  °[facility] [place] [address], [place] Phone: (336) 878-6027   °[facility] [address], [place], [place] (336)723-1848, Ext. 123 Mondays & Thursdays: 7-9 AM.  First 15 patients are seen on a first come, first serve basis. °  ° °Medicaid-accepting [place] Providers: ° °Organization         Address  Phone   Notes  °[facility] [address], [place] (336) 641-2100 Also accepts self-pay patients.  °[facility] [address], [place] ° (336) 856-9996   °[facility] [address],  (336) 288-8857   °[facility]   [address] [place] [address], [place] (336) 299-7000   °Veita Bland [address], [place]  ° (336) 373-1557 Only accepts [place] Access Medicaid patients after they have their name applied to their card.  ° °Self-Pay (no insurance) in [place]: ° °Organization         Address  Phone   Notes  °Sickle Cell Patients, [facility] [address], [place] (336) 832-1970   °[facility] [address], [place] (336) 832-4400   °[facility] ° [address], [place] (336) 992-4800   °[facility]/Dr. Osei-Bonsu ° [address] [place] [address], [place] or [address], [place] (336) 841-8500 Phone number for both [place] and [place] locations is the same.  °[facility] [address], [place] (336) 299-0000   °[facility] [address] [place] [address], [place] or [address] (336) 852-7530 °(336) 878-2260   °[facility] [address], [place] (336) 350-1642, phone; (336) 294-5005, fax Sees patients 1st and 3rd Saturday of every month.  Must not qualify for public or private insurance (i.e. Medicaid, Medicare, [place] Health Choice, Veterans' Benefits) • Household income should be no more than 200% of the poverty level •The clinic cannot treat you if you are pregnant or think you are pregnant • Sexually transmitted diseases are not treated at the clinic.  ° ° °Dental Care: °Organization         Address  Phone  Notes  °[facility] [address], [place] (336) 641-6152 Accepts children up to age 21 who are enrolled in Medicaid or [place] Health Choice; pregnant women with a Medicaid card; and children who have applied for Medicaid or [place] Health Choice, but were declined, whose parents can pay a reduced fee at time of service.  °[facility] [place]  [address], [place] (336) 641-7733 Accepts children up to age 21 who are enrolled in Medicaid or [place] Health Choice; pregnant women with a Medicaid card; and children who have applied for Medicaid or  Health Choice, but were declined, whose parents can pay a reduced fee at time of service.  °[facility] ° [address], [place] (336) 641-4533 Patients are seen by appointment only. Walk-ins are not accepted. [facility] will see patients 18 years of age and older. °Monday - Tuesday (8am-5pm) °Most Wednesdays (8:30-5pm) °$30 per visit, cash only  °[facility] ° [address], [place] (336) 641-4533 Patients are seen by appointment only. Walk-ins are not accepted. [facility] will see patients 18 years of age and older. °One Wednesday Evening (Monthly: Volunteer Based).  $30 per visit, cash only  °[facility]  (919) 537-3737 for adults; Children under age 4, call Graduate Pediatric  Dentistry at ([phone]) [phone]. Children aged 4-14, please call (919) [phone] to request a pediatric application. ° Dental services are provided in all areas of dental care including fillings, crowns and bridges, complete and partial dentures, implants, gum treatment, root canals, and extractions. Preventive care is also provided. Treatment is provided to both adults and children. °Patients are selected via a lottery and there is often a waiting list. °  °[facility] [address], °[place] ° (336) 763-8833 [email or web address] °  °[facility]   [address], [place], [place] (336)723-1848, Ext. 123 Second and Fourth Thursday of each month, opens at 6:30 AM; Clinic ends at 9 AM.  Patients are seen on a first-come first-served basis, and a limited number are seen during each clinic.  ° °[facility] ° [address], [place], [place] (336) 723-7904   Eligibility Requirements °You must have lived in [place], [place], or [place] counties for at least the last three months. °  You cannot be eligible for state or federal sponsored healthcare insurance, including Veterans Administration, Medicaid, or Medicare. °  You generally cannot be eligible for healthcare insurance through your employer.  °  How to apply: °Eligibility screenings are held every Tuesday and Wednesday afternoon from 1:00 pm until 4:00 pm. You do not need an appointment for the interview!  °[facility] [address], [place], [place] 336-631-2330   °[facility]  336-342-8273   °[facility]  336-703-3100   °[facility]  336-570-6415   ° °Behavioral Health Resources in the Community: °Intensive Outpatient Programs °Organization         Address  Phone  Notes  °[facility] [address], [place], [place] 336-878-6098   °[facility] [address], [place], [place] 336-832-9800   °[facility] [address], [place], [place] ° 336-882-2125   °[facility] [address],  °[place], [place] 1-800-853-5163 or 336-641-4981   °Substance Abuse Resources °Organization         Address  Phone  Notes  °[facility]  336-882-2125   °[facility]  336-784-9470   °[facility]  336-285-9073   °[facility]  336-845-3988   °Residential & Outpatient Substance Abuse Program  1-800-659-3381   °Psychological Services °Organization         Address  Phone  Notes  °[facility]  336- 832-9600   °[facility]  336- 378-7881   °[facility] [address], [place] 1-800-853-5163 or 336-641-4981   ° °Mobile Crisis Teams °Organization         Address  Phone  Notes  °[facility], Mobile Crisis Care Unit  1-877-626-1772   °Assertive °[facility] ° [address] [place], [place] 336-834-9664   °Sharon DeEsch [address] °[place] 336-554-5454   ° °Self-Help/Support Groups °Organization         Address  Phone             Notes  °[facility] [place] - variety of support groups  336- 373-1402 Call for more information  °Narcotics Anonymous (NA), [facility] [address], °[place]  2 meetings at this location  ° °Residential Treatment Programs °Organization         Address  Phone  Notes  °[facility] [address],    °[place]  1-866-801-8205   °[facility] ° [address], [place], [place] 704-293-8524   °[facility] [address], [place] 336-845-3988 Admissions: 8am-3pm M-F  °[facility] [address]    °[place], [place] 336-841-1104   °[facility] [address], [place], [place] 336-379-7146   °[facility] [address]  °,  336-285-9073   °[facility] - Intensive Outpatient [address],    [place], [place] 336-852-3033   °[facility] [address]  °[place], [place] 1-877-615-2722 or 336-784-9470   °[facility] [address] [place], [place] 336-227-7417 Accepts Medicaid  °[facility] [address]  °[place] 1-800-659-3381 Substance Abuse/Addiction Treatment  ° °[place] Behavioral Health Resources °Organization         Address  Phone  Notes  °[facility]  (888) 581-9988   °Julie Brannon, PhD [address] [place], [place]   (336) 349-5553 or (336) 951-0000   °[facility]   [address] °[place], [place] (336) 349-4454   °[facility] [address], [place], [place] (336) 342-8316 Insurance/Medicaid/sponsorship through [facility]  °[facility] [address]                                    [place], [place] (336) 342-8316 Therapy/tele-psych/case  °[facility] [address]  ° [place], [place] (336) 349-2233    °Dr. Arfeen  (336) 349-4544   °[facility] [place]  [employer] [place] [facility] 1) [address], [place] °2) [address], [place] °3)  [address], [place] (336) 349-3220 °(336) 342-7768 ° °(336) 342-8140   °[place] [facility] (336) 342-1394 or (336) 342-3537 (After Hours)    ° ° ° °

## 2015-01-02 ENCOUNTER — Encounter (HOSPITAL_BASED_OUTPATIENT_CLINIC_OR_DEPARTMENT_OTHER): Payer: Self-pay | Admitting: Emergency Medicine

## 2015-01-02 ENCOUNTER — Emergency Department (HOSPITAL_BASED_OUTPATIENT_CLINIC_OR_DEPARTMENT_OTHER)
Admission: EM | Admit: 2015-01-02 | Discharge: 2015-01-02 | Disposition: A | Discharge status: Home / Self Care | Payer: Medicaid Other | Attending: Emergency Medicine | Admitting: Emergency Medicine

## 2015-01-02 DIAGNOSIS — E119 Type 2 diabetes mellitus without complications: Secondary | ICD-10-CM | POA: Insufficient documentation

## 2015-01-02 DIAGNOSIS — Z792 Long term (current) use of antibiotics: Secondary | ICD-10-CM | POA: Insufficient documentation

## 2015-01-02 DIAGNOSIS — Z791 Long term (current) use of non-steroidal anti-inflammatories (NSAID): Secondary | ICD-10-CM | POA: Insufficient documentation

## 2015-01-02 DIAGNOSIS — Z4801 Encounter for change or removal of surgical wound dressing: Secondary | ICD-10-CM | POA: Diagnosis present

## 2015-01-02 DIAGNOSIS — Z5189 Encounter for other specified aftercare: Secondary | ICD-10-CM

## 2015-01-02 DIAGNOSIS — I10 Essential (primary) hypertension: Secondary | ICD-10-CM | POA: Insufficient documentation

## 2015-01-02 DIAGNOSIS — G43909 Migraine, unspecified, not intractable, without status migrainosus: Secondary | ICD-10-CM | POA: Insufficient documentation

## 2015-01-02 DIAGNOSIS — Z79899 Other long term (current) drug therapy: Secondary | ICD-10-CM | POA: Insufficient documentation

## 2015-01-02 DIAGNOSIS — Z21 Asymptomatic human immunodeficiency virus [HIV] infection status: Secondary | ICD-10-CM | POA: Diagnosis not present

## 2015-01-02 DIAGNOSIS — Z720 Tobacco use: Secondary | ICD-10-CM | POA: Diagnosis not present

## 2015-01-02 DIAGNOSIS — Z7952 Long term (current) use of systemic steroids: Secondary | ICD-10-CM | POA: Diagnosis not present

## 2015-01-02 DIAGNOSIS — E079 Disorder of thyroid, unspecified: Secondary | ICD-10-CM | POA: Diagnosis not present

## 2015-01-02 MED ORDER — HYDROCODONE-ACETAMINOPHEN 5-325 MG PO TABS: ORAL_TABLET | ORAL | Status: DC

## 2015-01-02 NOTE — ED Notes (Signed)
Wound cleaned with sterile saline and dressed with bacitracin and 2x2 gauge.

## 2015-01-02 NOTE — ED Notes (Signed)
Pt here for wound recheck to left arm.

## 2015-01-02 NOTE — ED Provider Notes (Signed)
CSN: [id number]     Arrival date & time 01/02/15  1144 History   First MD Initiated Contact with Patient 01/02/15 1211     Chief Complaint  Patient presents with  . Wound Check     (Consider location/radiation/quality/duration/timing/severity/associated sxs/prior Treatment) HPI   Taylor Allen is a 34 y.o. female presented to the ED for recheck of abscess and cellulitis. Patient had I and D performed, she's been taking doxycycline as directed for 3 days, she has 5 days left. Patient denies fever, chills, nausea, vomiting, history of diabetes, history of chronic steroid use HIV or other immunosuppressive conditions. States that her pain is moderate to severe at 7 out of 10, it is mildly relieved by Motrin. It is exacerbated by movement and palpation, just described as aching. She states that the packing fell out this morning and that there is a scant amount of purulent discharge.  Past Medical History  Diagnosis Date  . Hypertension   . Migraine   . Thyroid disease    Past Surgical History  Procedure Laterality Date  . Cholecystectomy     No family history on file. History  Substance Use Topics  . Smoking status: Current Every Day Smoker -- 0.50 packs/day    Types: Cigarettes  . Smokeless tobacco: Not on file  . Alcohol Use: No   OB History    No data available     Review of Systems  10 systems reviewed and found to be negative, except as noted in the HPI.   Allergies  Bactrim  Home Medications   Prior to Admission medications   Medication Sig Start Date End Date Taking? Authorizing Provider  Citalopram Hydrobromide (CELEXA PO) Take 20 mg by mouth.     Historical Provider, MD  cyclobenzaprine (FLEXERIL) 10 MG tablet Take 1 tablet (10 mg total) by mouth 2 (two) times daily as needed for muscle spasms. 12/23/13   Shari A Upstill, PA-C  doxycycline (VIBRAMYCIN) 100 MG capsule Take 1 capsule (100 mg total) by mouth 2 (two) times daily. One po bid x 7 days 12/31/14   Arby Barrette, MD  HYDROcodone-acetaminophen (NORCO/VICODIN) 5-325 MG per tablet Take 1-2 tablets by mouth every 6 hours as needed for pain. 01/02/15   Amilyah Nack, PA-C  ibuprofen (ADVIL,MOTRIN) 800 MG tablet Take 1 tablet (800 mg total) by mouth 3 (three) times daily. 12/23/13   Shari A Upstill, PA-C  ibuprofen (ADVIL,MOTRIN) 800 MG tablet Take 1 tablet (800 mg total) by mouth 3 (three) times daily. 12/31/14   Arby Barrette, MD  LEVOTHYROXINE SODIUM PO Take by mouth.    Historical Provider, MD  LISINOPRIL PO Take 25 mg by mouth.     Historical Provider, MD  metroNIDAZOLE (FLAGYL) 500 MG tablet Take 1 tablet (500 mg total) by mouth 2 (two) times daily. 06/03/13   Teressa Lower, NP  sulfamethoxazole-trimethoprim (SEPTRA DS) 800-160 MG per tablet Take 1 tablet by mouth every 12 (twelve) hours. 09/23/13   Kristen N Ward, DO  topiramate (TOPAMAX) 50 MG tablet Take 50 mg by mouth 2 (two) times daily.    Historical Provider, MD  VERAPAMIL HCL PO Take 25 mg by mouth.     Historical Provider, MD   BP 138/95 mmHg  Pulse 97  Temp(Src) 98.3 F (36.8 C) (Oral)  Resp 20  SpO2 100%  LMP 12/26/2014 Physical Exam  Constitutional: She is oriented to person, place, and time. She appears well-developed and well-nourished. No distress.  HENT:  Head: Normocephalic.  Eyes: Conjunctivae and EOM are normal.  Cardiovascular: Normal rate.   Pulmonary/Chest: Effort normal. No stridor.  Musculoskeletal: Normal range of motion.  Neurological: She is alert and oriented to person, place, and time.  Skin:     Psychiatric: She has a normal mood and affect.  Nursing note and vitals reviewed.   ED Course  Procedures (including critical care time) Labs Review Labs Reviewed - No data to display  Imaging Review No results found.   EKG Interpretation None      MDM   Final diagnoses:  Visit for wound check    Filed Vitals:   01/02/15 1147  BP: 138/95  Pulse: 97  Temp: 98.3 F (36.8 C)  TempSrc:  Oral  Resp: 20  SpO2: 100%    Taylor Allen is a pleasant 34 y.o. female for abscess recheck. The packing came out this morning, the cellulitis has completely resolved. Patient has no systemic signs of infection. Wound is irrigated, I do not think it would benefit her to repack. I've written her prescription for Vicodin at her request.   Evaluation does not show pathology that would require ongoing emergent intervention or inpatient treatment. Pt is hemodynamically stable and mentating appropriately. Discussed findings and plan with patient/guardian, who agrees with care plan. All questions answered. Return precautions discussed and outpatient follow up given.   New Prescriptions   HYDROCODONE-ACETAMINOPHEN (NORCO/VICODIN) 5-325 MG PER TABLET    Take 1-2 tablets by mouth every 6 hours as needed for pain.         Wynetta Emeryicole Catheline Hixon, PA-C 01/02/15 1224  Rolan BuccoMelanie Belfi, MD 01/02/15 1556

## 2015-01-02 NOTE — Discharge Instructions (Signed)
Take your antibiotics as directed and to completion. You should never have any leftover antibiotics! Push fluids and stay well hydrated.   Any antibiotic use can reduce the efficacy of hormonal birth control. Please use back up method of contraception.   Take vicodin for breakthrough pain, do not drink alcohol, drive, care for children or do other critical tasks while taking vicodin.   If you develop fever, have vomiting or if the swelling and redness starts spreading , return to the emergency room immediately for a recheck.  Please follow with your primary care doctor in the next 2 days for a check-up. They must obtain records for further management.

## 2015-02-12 ENCOUNTER — Emergency Department (HOSPITAL_BASED_OUTPATIENT_CLINIC_OR_DEPARTMENT_OTHER)
Admission: EM | Admit: 2015-02-12 | Discharge: 2015-02-12 | Disposition: A | Discharge status: Home / Self Care | Payer: Medicaid Other | Attending: Emergency Medicine | Admitting: Emergency Medicine

## 2015-02-12 ENCOUNTER — Encounter (HOSPITAL_BASED_OUTPATIENT_CLINIC_OR_DEPARTMENT_OTHER): Payer: Self-pay

## 2015-02-12 DIAGNOSIS — G43909 Migraine, unspecified, not intractable, without status migrainosus: Secondary | ICD-10-CM | POA: Diagnosis not present

## 2015-02-12 DIAGNOSIS — F329 Major depressive disorder, single episode, unspecified: Secondary | ICD-10-CM | POA: Insufficient documentation

## 2015-02-12 DIAGNOSIS — I10 Essential (primary) hypertension: Secondary | ICD-10-CM | POA: Insufficient documentation

## 2015-02-12 DIAGNOSIS — Z79899 Other long term (current) drug therapy: Secondary | ICD-10-CM | POA: Diagnosis not present

## 2015-02-12 DIAGNOSIS — R05 Cough: Secondary | ICD-10-CM | POA: Diagnosis present

## 2015-02-12 DIAGNOSIS — E079 Disorder of thyroid, unspecified: Secondary | ICD-10-CM | POA: Diagnosis not present

## 2015-02-12 DIAGNOSIS — J40 Bronchitis, not specified as acute or chronic: Secondary | ICD-10-CM | POA: Insufficient documentation

## 2015-02-12 DIAGNOSIS — Z720 Tobacco use: Secondary | ICD-10-CM | POA: Diagnosis not present

## 2015-02-12 HISTORY — DX: Depression, unspecified: F32.A

## 2015-02-12 HISTORY — DX: Major depressive disorder, single episode, unspecified: F32.9

## 2015-02-12 MED ORDER — ALBUTEROL SULFATE HFA 108 (90 BASE) MCG/ACT IN AERS
2.0000 | INHALATION_SPRAY | RESPIRATORY_TRACT | Status: DC
  Administered 2015-02-12: 2 via RESPIRATORY_TRACT
  Filled 2015-02-12: qty 6.7

## 2015-02-12 MED ORDER — PREDNISONE 10 MG PO TABS: 20.0000 mg | ORAL_TABLET | Freq: Every day | ORAL | Status: DC

## 2015-02-12 NOTE — ED Notes (Signed)
NP cough x 3 weeks that is worsening.  Also concerned from urinary incontinence when coughing.

## 2015-02-12 NOTE — ED Provider Notes (Signed)
CSN: [id number]     Arrival date & time 02/12/15  0757 History   First MD Initiated Contact with Patient 02/12/15 [phone]     Chief Complaint  Patient presents with  . Cough     (Consider location/radiation/quality/duration/timing/severity/associated sxs/prior Treatment) HPI Comments: Patient here complaining of nonproductive cough 3 weeks. No associated fever or chills. No weight loss, night sweats, hemoptysis. Cough is worse in the evenings. She continues to use tobacco products. No associated anginal chest pain or CHF symptoms. Symptoms persistent and nothing makes them better. Has used over-the-counter medications without relief.  Patient is a 34 y.o. female presenting with cough. The history is provided by the patient.  Cough   Past Medical History  Diagnosis Date  . Hypertension   . Migraine   . Thyroid disease   . Depression    Past Surgical History  Procedure Laterality Date  . Cholecystectomy     No family history on file. History  Substance Use Topics  . Smoking status: Current Every Day Smoker -- 0.50 packs/day    Types: Cigarettes  . Smokeless tobacco: Not on file  . Alcohol Use: No   OB History    No data available     Review of Systems  Respiratory: Positive for cough.   All other systems reviewed and are negative.     Allergies  Bactrim  Home Medications   Prior to Admission medications   Medication Sig Start Date End Date Taking? Authorizing Provider  Citalopram Hydrobromide (CELEXA PO) Take 20 mg by mouth.     Historical Provider, MD  LEVOTHYROXINE SODIUM PO Take by mouth.    Historical Provider, MD  LISINOPRIL PO Take 25 mg by mouth.     Historical Provider, MD  topiramate (TOPAMAX) 50 MG tablet Take 50 mg by mouth 2 (two) times daily.    Historical Provider, MD  VERAPAMIL HCL PO Take 25 mg by mouth.     Historical Provider, MD   BP 157/96 mmHg  Pulse 102  Temp(Src) 98.4 F (36.9 C) (Oral)  Resp 18  Ht  (1.676 m)  Wt 265 lb  (120.203 kg)  BMI 42.79 kg/m2  SpO2 98%  LMP 01/23/2015 Physical Exam  Constitutional: She is oriented to person, place, and time. She appears well-developed and well-nourished.  Non-toxic appearance. No distress.  HENT:  Head: Normocephalic and atraumatic.  Eyes: Conjunctivae, EOM and lids are normal. Pupils are equal, round, and reactive to light.  Neck: Normal range of motion. Neck supple. No tracheal deviation present. No thyroid mass present.  Cardiovascular: Normal rate, regular rhythm and normal heart sounds.  Exam reveals no gallop.   No murmur heard. Pulmonary/Chest: Effort normal. No stridor. No respiratory distress. She has decreased breath sounds. She has no wheezes. She has no rhonchi. She has no rales.  Abdominal: Soft. Normal appearance and bowel sounds are normal. She exhibits no distension. There is no tenderness. There is no rebound and no CVA tenderness.  Musculoskeletal: Normal range of motion. She exhibits no edema or tenderness.  Neurological: She is alert and oriented to person, place, and time. She has normal strength. No cranial nerve deficit or sensory deficit. GCS eye subscore is 4. GCS verbal subscore is 5. GCS motor subscore is 6.  Skin: Skin is warm and dry. No abrasion and no rash noted.  Psychiatric: She has a normal mood and affect. Her speech is normal and behavior is normal.  Nursing note and vitals reviewed.   ED Course  Procedures (including critical care time) Labs Review Labs Reviewed - No data to display  Imaging Review No results found.   EKG Interpretation None      MDM   Final diagnoses:  Bronchitis    Patient likely bronchitis. Smoking cessation conversation held.    Toy BakerAnthony T Shanan Mcmiller, MD 02/12/15 [phone]

## 2015-02-12 NOTE — ED Notes (Signed)
MD at bedside. 

## 2015-02-12 NOTE — Discharge Instructions (Signed)

## 2015-06-14 ENCOUNTER — Emergency Department (HOSPITAL_BASED_OUTPATIENT_CLINIC_OR_DEPARTMENT_OTHER)
Admission: EM | Admit: 2015-06-14 | Discharge: 2015-06-14 | Disposition: A | Discharge status: Home / Self Care | Payer: Medicaid Other | Attending: Emergency Medicine | Admitting: Emergency Medicine

## 2015-06-14 ENCOUNTER — Encounter (HOSPITAL_BASED_OUTPATIENT_CLINIC_OR_DEPARTMENT_OTHER): Payer: Self-pay | Admitting: *Deleted

## 2015-06-14 ENCOUNTER — Emergency Department (HOSPITAL_BASED_OUTPATIENT_CLINIC_OR_DEPARTMENT_OTHER): Payer: Medicaid Other

## 2015-06-14 DIAGNOSIS — I10 Essential (primary) hypertension: Secondary | ICD-10-CM | POA: Diagnosis not present

## 2015-06-14 DIAGNOSIS — Z720 Tobacco use: Secondary | ICD-10-CM | POA: Insufficient documentation

## 2015-06-14 DIAGNOSIS — R079 Chest pain, unspecified: Secondary | ICD-10-CM | POA: Diagnosis present

## 2015-06-14 DIAGNOSIS — Z79899 Other long term (current) drug therapy: Secondary | ICD-10-CM | POA: Insufficient documentation

## 2015-06-14 DIAGNOSIS — G43909 Migraine, unspecified, not intractable, without status migrainosus: Secondary | ICD-10-CM | POA: Diagnosis not present

## 2015-06-14 DIAGNOSIS — E079 Disorder of thyroid, unspecified: Secondary | ICD-10-CM | POA: Insufficient documentation

## 2015-06-14 DIAGNOSIS — R0789 Other chest pain: Secondary | ICD-10-CM

## 2015-06-14 DIAGNOSIS — F329 Major depressive disorder, single episode, unspecified: Secondary | ICD-10-CM | POA: Diagnosis not present

## 2015-06-14 DIAGNOSIS — Z3202 Encounter for pregnancy test, result negative: Secondary | ICD-10-CM | POA: Diagnosis not present

## 2015-06-14 LAB — TROPONIN I

## 2015-06-14 LAB — BASIC METABOLIC PANEL
ANION GAP: 10 (ref 5–15)
BUN: 15 mg/dL (ref 6–20)
CO2: 19 mmol/L — ABNORMAL LOW (ref 22–32)
CREATININE: 0.69 mg/dL (ref 0.44–1.00)
Calcium: 8.7 mg/dL — ABNORMAL LOW (ref 8.9–10.3)
Chloride: 106 mmol/L (ref 101–111)
GFR calc non Af Amer: >60 mL/min (ref >60)
Glucose, Bld: 100 mg/dL — ABNORMAL HIGH (ref 65–99)
POTASSIUM: 4.1 mmol/L (ref 3.5–5.1)
Sodium: 135 mmol/L (ref 135–145)

## 2015-06-14 LAB — CBC
HEMATOCRIT: 41.8 % (ref 36.0–46.0)
HEMOGLOBIN: 14 g/dL (ref 12.0–15.0)
MCH: 29.5 pg (ref 26.0–34.0)
MCHC: 33.5 g/dL (ref 30.0–36.0)
MCV: 88 fL (ref 78.0–100.0)
Platelets: 261 10*3/uL (ref 150–400)
RBC: 4.75 MIL/uL (ref 3.87–5.11)
RDW: 13.1 % (ref 11.5–15.5)
WBC: 9.3 10*3/uL (ref 4.0–10.5)

## 2015-06-14 LAB — PREGNANCY, URINE: PREG TEST UR: NEGATIVE

## 2015-06-14 LAB — D-DIMER, QUANTITATIVE (NOT AT ARMC): D DIMER QUANT: 0.35 ug{FEU}/mL (ref 0.00–0.48)

## 2015-06-14 MED ORDER — NAPROXEN 500 MG PO TABS: 500.0000 mg | ORAL_TABLET | Freq: Two times a day (BID) | ORAL | Status: DC

## 2015-06-14 MED ORDER — IBUPROFEN 400 MG PO TABS
400.0000 mg | ORAL_TABLET | Freq: Once | ORAL | Status: AC
  Administered 2015-06-14: 400 mg via ORAL
  Filled 2015-06-14: qty 1

## 2015-06-14 NOTE — ED Notes (Signed)
Woke with pain in her chest. Pain increases with movement and breathing. Pain is in her right chest.

## 2015-06-14 NOTE — ED Provider Notes (Addendum)
CSN: [id number]     Arrival date & time 06/14/15  2010 History  This chart was scribed for Taylor Sprout, MD by Octavia Heir, ED Scribe. This patient was seen in room MH12/MH12 and the patient's care was started at 10:38 PM.    Chief Complaint  Patient presents with  . Chest Pain      The history is provided by the patient. No language interpreter was used.   HPI Comments: Taylor Allen is a 34 y.o. female who presents to the Emergency Department complaining of gradual worsening chest pain onset this morning. Pt has associated shortness of breath and cough. Pt notes she woke up this morning with her chest pain and notes the pain increases with movement and breathing. She has not taken any medication to alleviate the pain. Pt is on birth control and she is a smoker. Pt's last period was May 24th. Pt denies hx of blood clots, recent travel, abdominal pain, numbness and travel and legs.   Past Medical History  Diagnosis Date  . Hypertension   . Migraine   . Thyroid disease   . Depression    Past Surgical History  Procedure Laterality Date  . Cholecystectomy     No family history on file. History  Substance Use Topics  . Smoking status: Current Every Day Smoker -- 0.50 packs/day    Types: Cigarettes  . Smokeless tobacco: Not on file  . Alcohol Use: No   OB History    No data available     Review of Systems  A complete 10 system review of systems was obtained and all systems are negative except as noted in the HPI and PMH.    Allergies  Bactrim  Home Medications   Prior to Admission medications   Medication Sig Start Date End Date Taking? Authorizing Provider  Citalopram Hydrobromide (CELEXA PO) Take 20 mg by mouth.     Historical Provider, MD  LEVOTHYROXINE SODIUM PO Take by mouth.    Historical Provider, MD  LISINOPRIL PO Take 25 mg by mouth.     Historical Provider, MD  predniSONE (DELTASONE) 10 MG tablet Take 2 tablets (20 mg total) by mouth daily. 02/12/15    Lorre Nick, MD  topiramate (TOPAMAX) 50 MG tablet Take 50 mg by mouth 2 (two) times daily.    Historical Provider, MD  VERAPAMIL HCL PO Take 25 mg by mouth.     Historical Provider, MD   Triage vitals: BP 157/87 mmHg  Pulse 95  Temp(Src) 98.2 F (36.8 C) (Oral)  Resp 20  Ht 5\' 6"  (1.676 m)  Wt 270 lb (122.471 kg)  BMI 43.60 kg/m2  SpO2 98%  LMP 05/19/2015 Physical Exam  Constitutional: She is oriented to person, place, and time. She appears well-developed and well-nourished. No distress.  HENT:  Head: Normocephalic.  Eyes: Conjunctivae are normal. Pupils are equal, round, and reactive to light. No scleral icterus.  Neck: Normal range of motion. Neck supple. No thyromegaly present.  Cardiovascular: Normal rate and regular rhythm.  Exam reveals no gallop and no friction rub.   No murmur heard. Pulmonary/Chest: Effort normal and breath sounds normal. No respiratory distress. She has no wheezes. She has no rales.  Right sided chest wall tenderness  Abdominal: Soft. Bowel sounds are normal. She exhibits no distension. There is no tenderness. There is no rebound.  Musculoskeletal: Normal range of motion.  Neurological: She is alert and oriented to person, place, and time.  Skin: Skin is warm and  dry. No rash noted.  Psychiatric: She has a normal mood and affect. Her behavior is normal.  Nursing note and vitals reviewed.   ED Course  Procedures  DIAGNOSTIC STUDIES: Oxygen Saturation is 98% on RA, normal by my interpretation.  COORDINATION OF CARE:  10:39 PM Discussed treatment plan which includes d-dimer with pt at bedside and pt agreed to plan.    Labs Review Labs Reviewed  BASIC METABOLIC PANEL - Abnormal; Notable for the following:    CO2 19 (*)    Glucose, Bld 100 (*)    Calcium 8.7 (*)    All other components within normal limits  CBC  TROPONIN I  PREGNANCY, URINE  D-DIMER, QUANTITATIVE (NOT AT [facility])    Imaging Review Dg Chest 2 View  06/14/2015   CLINICAL  DATA:  34 year old female with wound woke up with right-sided chest pain today. Pain increases with deep inspiration and with movement.  EXAM: CHEST  2 VIEW  COMPARISON:  Chest x-ray 01/25/2015.  FINDINGS: Lung volumes are normal. No consolidative airspace disease. No pleural effusions. No pneumothorax. No pulmonary nodule or mass noted. Pulmonary vasculature and the cardiomediastinal silhouette are within normal limits.  IMPRESSION: No radiographic evidence of acute cardiopulmonary disease.   Electronically Signed   By: Trudie Reed M.D.   On: 06/14/2015 21:22     EKG Interpretation   Date/Time:  Monday June 14 2015 20:19:50 EDT Ventricular Rate:  93 PR Interval:  148 QRS Duration: 86 QT Interval:  362 QTC Calculation: 450 R Axis:   67 Text Interpretation:  Normal sinus rhythm Normal ECG No significant change  since last tracing Confirmed by Anitra Lauth  MD, Alphonzo Lemmings ([id number]) on 06/14/2015  8:23:08 PM      MDM   Final diagnoses:  Chest wall pain   patient presenting with chest wall pain that's been persistent all day. Is pleuritic in nature and patient has noted a cough for the last 3 months after bronchitis. Patient is a smoker and does take birth control pills. She denies any unilateral leg pain or swelling, immobilization or recent travel. No history of blood clots. EKG within normal limits. Chest x-ray, CBC, troponin, UPT, BMP all done prior to patient being seen are within normal limits. She is low risk Wells criteria and a d-dimer is negative. Patient will be diagnosed with chest wall pain and encouraged to take anti-inflammatory.  I personally performed the services described in this documentation, which was scribed in my presence.  The recorded information has been reviewed and considered.   Taylor Sprout, MD 06/14/15 [id number]  Taylor Sprout, MD 06/14/15 [phone]

## 2015-06-14 NOTE — Discharge Instructions (Signed)

## 2015-08-21 ENCOUNTER — Emergency Department (HOSPITAL_BASED_OUTPATIENT_CLINIC_OR_DEPARTMENT_OTHER)
Admission: EM | Admit: 2015-08-21 | Discharge: 2015-08-21 | Disposition: A | Discharge status: Home / Self Care | Payer: Medicaid Other | Attending: Physician Assistant | Admitting: Physician Assistant

## 2015-08-21 ENCOUNTER — Encounter (HOSPITAL_BASED_OUTPATIENT_CLINIC_OR_DEPARTMENT_OTHER): Payer: Self-pay | Admitting: *Deleted

## 2015-08-21 DIAGNOSIS — Z79899 Other long term (current) drug therapy: Secondary | ICD-10-CM | POA: Insufficient documentation

## 2015-08-21 DIAGNOSIS — Z720 Tobacco use: Secondary | ICD-10-CM | POA: Insufficient documentation

## 2015-08-21 DIAGNOSIS — G43909 Migraine, unspecified, not intractable, without status migrainosus: Secondary | ICD-10-CM | POA: Diagnosis not present

## 2015-08-21 DIAGNOSIS — L02416 Cutaneous abscess of left lower limb: Secondary | ICD-10-CM | POA: Diagnosis present

## 2015-08-21 DIAGNOSIS — E039 Hypothyroidism, unspecified: Secondary | ICD-10-CM | POA: Diagnosis not present

## 2015-08-21 DIAGNOSIS — Z791 Long term (current) use of non-steroidal anti-inflammatories (NSAID): Secondary | ICD-10-CM | POA: Insufficient documentation

## 2015-08-21 DIAGNOSIS — Z7952 Long term (current) use of systemic steroids: Secondary | ICD-10-CM | POA: Insufficient documentation

## 2015-08-21 DIAGNOSIS — I10 Essential (primary) hypertension: Secondary | ICD-10-CM | POA: Diagnosis not present

## 2015-08-21 DIAGNOSIS — L03311 Cellulitis of abdominal wall: Secondary | ICD-10-CM | POA: Diagnosis not present

## 2015-08-21 DIAGNOSIS — L03116 Cellulitis of left lower limb: Secondary | ICD-10-CM | POA: Diagnosis not present

## 2015-08-21 DIAGNOSIS — Z8659 Personal history of other mental and behavioral disorders: Secondary | ICD-10-CM | POA: Insufficient documentation

## 2015-08-21 LAB — CBG MONITORING, ED: Glucose-Capillary: 81 mg/dL (ref 65–99)

## 2015-08-21 MED ORDER — LIDOCAINE-EPINEPHRINE (PF) 2 %-1:200000 IJ SOLN
10.0000 mL | Freq: Once | INTRAMUSCULAR | Status: AC
  Administered 2015-08-21: 10 mL
  Filled 2015-08-21: qty 10

## 2015-08-21 MED ORDER — CLINDAMYCIN HCL 150 MG PO CAPS: 450.0000 mg | ORAL_CAPSULE | Freq: Three times a day (TID) | ORAL | Status: DC

## 2015-08-21 NOTE — ED Notes (Signed)
Abscess x 3- two behind left leg, one right side abdominal wall

## 2015-08-21 NOTE — Discharge Instructions (Signed)
1. Medications: ibuprofen for pain, clindamycin, usual home medications 2. Treatment: rest, drink plenty of fluids, use warm compresses,  3. Follow Up: Please followup with your primary doctor in 2-3 days for discussion of your diagnoses and further evaluation after today's visit; if you do not have a primary care doctor use the resource guide provided to find one; Please return to the ER for fevers, chills, nausea, vomiting or other signs of infection    Community-Associated MRSA CA-MRSA stands for community-associated methicillin-resistant Staphylococcus aureus. MRSA is a type of bacteria that is resistant to some common antibiotics. It can cause infections in the skin and many other places in the body. Staphylococcus aureus, often called "staph," is a bacteria that normally lives on the skin or in the nose. Staph on the surface of the skin or in the nose does not cause problems. However, if the staph enters the body through a cut, wound, or break in the skin, an infection can happen. Up until recently, infections with the MRSA type of staph mainly occurred in hospitals and other health care settings. There are now increasing problems with MRSA infections in the community as well. Infections with MRSA may be very serious or even life threatening. CA-MRSA is becoming more common. It is known to spread in crowded settings, in jails and prisons, and in situations where there is close skin-to-skin contact, such as during sporting events or in locker rooms. MRSA can be spread through shared items, such as children's toys, razors, towels, or sports equipment.  CAUSES All staph, including MRSA, are normally harmless unless they enter the body through a scratch, cut, or wound, such as with surgery. All staph, including MRSA, can be spread from person-to-person by touching contaminated objects or through direct contact.  MRSA now causes illness in people who have not been in hospitals or other health care  facilities. Cases of MRSA diseases in the community have been associated with:  Recent antibiotic use.  Sharing contaminated towels or clothes.  Having active skin diseases.  Participating in contact sports.  Living in crowded settings.  Intravenous (IV) drug use.  Community-associated MRSA infections are usually skin infections, but may cause other severe illnesses.  Staph bacteria are one of the most common causes of skin infection. However, they are also a common cause of pneumonia, bone or joint infections, and bloodstream infections. DIAGNOSIS Diagnosis of MRSA is done by cultures of fluid samples that may come from:  Swabs taken from cuts or wounds in infected areas.  Nasal swabs.  Saliva or deep cough specimens from the lungs (sputum).  Urine.  Blood. Many people are "colonized" with MRSA but have no signs of infection. This means that people carry the MRSA germ on their skin or in their nose and may never develop MRSA infection.  TREATMENT  Treatment varies and is based on how serious, how deep, or how extensive the infection is. For example:  Some skin infections, such as a small boil or abscess, may be treated by draining yellowish-white fluid (pus) from the site of the infection.  Deeper or more widespread soft tissue infections are usually treated with surgery to drain pus and with antibiotic medicine given by vein or by mouth. This may be recommended even if you are pregnant.  Serious infections may require a hospital stay. If antibiotics are given, they may be needed for several weeks. PREVENTION Because many people are colonized with staph, including MRSA, preventing the spread of the bacteria from person-to-person is most  important. The best way to prevent the spread of bacteria and other germs is through proper hand washing or by using alcohol-based hand disinfectants. The following are other ways to help prevent MRSA infection within community settings.    Wash your hands frequently with soap and water for at least 15 seconds. Otherwise, use alcohol-based hand disinfectants when soap and water is not available.  Make sure people who live with you wash their hands often, too.  Do not share personal items. For example, avoid sharing razors and other personal hygiene items, towels, clothing, and athletic equipment.  Wash and dry your clothes and bedding at the warmest temperatures recommended on the labels.  Keep wounds covered. Pus from infected sores may contain MRSA and other bacteria. Keep cuts and abrasions clean and covered with germ-free (sterile), dry bandages until they are healed.  If you have a wound that appears infected, ask your caregiver if a culture for MRSA and other bacteria should be done.  If you are breastfeeding, talk to your caregiver about MRSA. You may be asked to temporarily stop breastfeeding. HOME CARE INSTRUCTIONS   Take your antibiotics as directed. Finish them even if you start to feel better.  Avoid close contact with those around you as much as possible. Do not use towels, razors, toothbrushes, bedding, or other items that will be used by others.  To fight the infection, follow your caregiver's instructions for wound care. Wash your hands before and after changing your bandages.  If you have an intravascular device, such as a catheter, make sure you know how to care for it.  Be sure to tell any health care providers that you have MRSA so they are aware of your infection. SEEK IMMEDIATE MEDICAL CARE IF:  The infection appears to be getting worse. Signs include:  Increased warmth, redness, or tenderness around the wound site.  A red line that extends from the infection site.  A dark color in the area around the infection.  Wound drainage that is tan, yellow, or green.  A bad smell coming from the wound.  You feel sick to your stomach (nauseous) and throw up (vomit) or cannot keep medicine  down.  You have a fever.  Your baby is older than 3 months with a rectal temperature of 102F (38.9C) or higher.  Your baby is 29 months old or younger with a rectal temperature of 100.37F (38C) or higher.  You have difficulty breathing. MAKE SURE YOU:   Understand these instructions.  Will watch your condition.  Will get help right away if you are not doing well or get worse. Document Released: 03/16/2006 Document Revised: 04/27/2014 Document Reviewed: 03/16/2011 [facility] Patient Information 2015 [place], [place]. This information is not intended to replace advice given to you by your health care provider. Make sure you discuss any questions you have with your health care provider.   Cellulitis Cellulitis is an infection of the skin and the tissue beneath it. The infected area is usually red and tender. Cellulitis occurs most often in the arms and lower legs.  CAUSES  Cellulitis is caused by bacteria that enter the skin through cracks or cuts in the skin. The most common types of bacteria that cause cellulitis are staphylococci and streptococci. SIGNS AND SYMPTOMS   Redness and warmth.  Swelling.  Tenderness or pain.  Fever. DIAGNOSIS  Your health care provider can usually determine what is wrong based on a physical exam. Blood tests may also be done. TREATMENT  Treatment usually  involves taking an antibiotic medicine. HOME CARE INSTRUCTIONS   Take your antibiotic medicine as directed by your health care provider. Finish the antibiotic even if you start to feel better.  Keep the infected arm or leg elevated to reduce swelling.  Apply a warm cloth to the affected area up to 4 times per day to relieve pain.  Take medicines only as directed by your health care provider.  Keep all follow-up visits as directed by your health care provider. SEEK MEDICAL CARE IF:   You notice red streaks coming from the infected area.  Your red area gets larger or turns dark in  color.  Your bone or joint underneath the infected area becomes painful after the skin has healed.  Your infection returns in the same area or another area.  You notice a swollen bump in the infected area.  You develop new symptoms.  You have a fever. SEEK IMMEDIATE MEDICAL CARE IF:   You feel very sleepy.  You develop vomiting or diarrhea.  You have a general ill feeling (malaise) with muscle aches and pains. MAKE SURE YOU:   Understand these instructions.  Will watch your condition.  Will get help right away if you are not doing well or get worse. Document Released: 09/20/2005 Document Revised: 04/27/2014 Document Reviewed: 02/26/2012 [facility] Patient Information 2015 [place], [place]. This information is not intended to replace advice given to you by your health care provider. Make sure you discuss any questions you have with your health care provider.    Emergency Department Resource Guide 1) Find a Doctor and Pay Out of Pocket Although you won't have to find out who is covered by your insurance plan, it is a good idea to ask around and get recommendations. You will then need to call the office and see if the doctor you have chosen will accept you as a new patient and what types of options they offer for patients who are self-pay. Some doctors offer discounts or will set up payment plans for their patients who do not have insurance, but you will need to ask so you aren't surprised when you get to your appointment.  2) Contact Your Local Health Department Not all health departments have doctors that can see patients for sick visits, but many do, so it is worth a call to see if yours does. If you don't know where your local health department is, you can check in your phone book. The CDC also has a tool to help you locate your state's health department, and many state websites also have listings of all of their local health departments.  3) Find a Walk-in Clinic If your illness  is not likely to be very severe or complicated, you may want to try a walk in clinic. These are popping up all over the country in pharmacies, drugstores, and shopping centers. They're usually staffed by nurse practitioners or physician assistants that have been trained to treat common illnesses and complaints. They're usually fairly quick and inexpensive. However, if you have serious medical issues or chronic medical problems, these are probably not your best option.  No Primary Care Doctor: - Call Health Connect at  [phone] - they can help you locate a primary care doctor that  accepts your insurance, provides certain services, etc. - Physician Referral Service- [phone]  Chronic Pain Problems: Organization         Address  Phone   Notes  [facility]  [phone] Patients need to  be referred by their primary care doctor.   Medication Assistance: Organization         Address  Phone   Notes  [facility] Medication [facility] [address] [place] [place], [place] [address] [phone] --Must be a resident of [facility] -- Must have NO insurance coverage whatsoever (no Medicaid/ Medicare, etc.) -- The pt. MUST have a primary care doctor that directs their care regularly and follows them in the community   [facility]  [phone]   [employer]  [phone]    Agencies that provide inexpensive medical care: Organization         Address  Phone   Notes  [facility]  [phone]   [facility]    [phone]   [facility] [address] [place], [place] [address] [phone]   [facility] [address] [place] [place] [phone]   Planned Parenthood    [phone]   [facility]    [phone]   [facility] and [facility]  [address], [place] Phone:  [phone], Fax:  [phone] Hours of Operation:  9 am - 6  pm, M-F.  Also accepts Medicaid/Medicare and self-pay.  [facility]  [address], [place] Phone: [phone], Fax: [phone]. Hours of Operation:  8:30 am - 5:30 pm, M-F.  Also accepts Medicaid and self-pay.  [facility] [place] [address] [place] Phone: [phone]   [facility] [address] Natasha Bence [place], [place] [phone], Ext. [phone] Mondays & Thursdays: 7-9 AM.  First 15 patients are seen on a first come, first serve basis.    Medicaid-accepting [facility] Providers:  Organization         Address  Phone   Notes  [facility] [address], [place] [phone] Also accepts self-pay patients.  [facility] [address] Laurell Josephs [place], [place]  [phone]   [facility] [address], [place] [phone]   [facility] [address], [place] [phone]   Renaye Rakers [address], [place]   [phone] Only accepts [place] Access [place] patients after they have their name applied to their card.   Self-Pay (no insurance) in [facility]:  Organization         Address  Phone   Notes  Sickle Cell Patients, [facility] Medicine [address] [place], [place] [phone]   [facility] Urgent Care [address] [place], [place] [phone]   [facility]  [address], [place] [phone]   [facility]/Dr. Osei-Bonsu  [address], [place] or [address], [place] [phone] Phone number for both [place] and [place] locations is the same.  [facility] [address] [place] [phone]   [facility] [address] [place] or [address] [phone]   [facility] [address] [place] [phone], phone; [phone], fax Sees patients 1st and 3rd Saturday of every month.  Must not qualify for public or private insurance (i.e. Medicaid, Medicare, [place] Health Choice, Veterans' Benefits)  Household income should be  no more than 200% of the poverty level The clinic cannot treat you if you are pregnant or think you are pregnant  Sexually transmitted diseases are not treated at the clinic.    Dental Care: Organization         Address  Phone  Notes  [facility] Department of [facility] [address] [place], [place] [phone] Accepts children up to age 30 who are enrolled in [place] or [place] Health Choice; pregnant women with a Medicaid card; and children who have applied for Medicaid or [place] Health Choice, but were declined, whose parents can pay a reduced fee at time of service.  [facility]  [address], [place] [phone] Accepts children up to age 39 who are enrolled in [place] or [place] Health Choice; pregnant women with a Medicaid card; and children who have applied for Medicaid or [place] Health Choice, but were declined, whose parents can pay a reduced fee at time of service.  [facility]  [address] [place], [place] [phone] Patients are seen by appointment only. Walk-ins are not accepted. [facility] will see patients 12 years of age and older. Monday - Tuesday (8am-5pm) Most Wednesdays (8:30-5pm) $30 per visit, cash only  [facility] Adult [facility]  [address], [facility] [phone] Patients are seen by appointment only. Walk-ins are not accepted. [facility] will see patients 76 years of age and older. One Wednesday Evening (Monthly: Volunteer Based).  $30 per visit, cash only  [employer]  [phone] for adults; Children under age 73, call Graduate Pediatric Dentistry at [phone]. Children aged 89-14, please call [phone] to request a pediatric application.  Dental services are provided in all areas of dental care including fillings, crowns and bridges, complete and partial dentures, implants, gum treatment, root canals, and extractions. Preventive care is also provided. Treatment is provided to both adults and children. Patients are selected via a lottery and there is often a waiting list.   [facility] [address] [place]  [phone] [email or web address]   [facility] [address] [place], [place] [phone], Ext. [phone] Second and Fourth Thursday of each month, opens at 6:30 AM; Clinic ends at 9 AM.  Patients are seen on a first-come first-served basis, and a limited number are seen during each clinic.   [facility]  [address] Ether Griffins [place], [place] [phone]   Eligibility Requirements You must have lived in [place], [place], or [place] counties for at least the last three months.   You cannot be eligible for state or federal sponsored [place], including [employer], [place], or [employer].   You generally cannot be eligible for healthcare insurance through your employer.    How to apply: Eligibility screenings are held every Tuesday and Wednesday afternoon from 1:00 pm until 4:00 pm. You do not need an appointment for the interview!  [facility] [address], [place], [place] [phone]   [facility] Health Department  [phone]   [facility] Health Department  [phone]   [facility] Department  [phone]    Behavioral Health Resources in the Community: Intensive Outpatient Programs Organization         Address  Phone  Notes  [facility] Services [address] [place], [place] [phone]   [facility] Outpatient [address], [place], [place] [phone]   [facility]  [address] [place], [place]  [phone]   [facility] Mental Health [address]  [place], [place] [phone] or [phone]   Substance Abuse Resources Organization         Address  Phone  Notes  [facility]  [phone]   [facility]  [phone]   [facility] [place]  [phone]   Floydene Flock  [phone]   Residential & Outpatient Substance Abuse Program  [phone]   Psychological Services Organization         Address  Phone  Notes  [facility] Behavioral Health  [phone]   [facility]  [phone]   [facility] Mental Health [address], [place] [phone] or [phone]    Mobile Crisis Teams Organization         Address  Phone  Notes  [facility], Mobile Crisis Care Unit  [phone]   [facility]  [address] [place], [place] [phone]   Doristine Locks [address] [place] [phone]    Self-Help/Support Groups Organization         Address  Phone             Notes  [facility] [place] - variety of support groups  336- I7437963 Call for more information  Narcotics Anonymous (NA), [facility] [address], [employer] [place]  2 meetings at this location   Statistician         Address  Phone  Notes  [facility] [address],    [place]  [phone]   [facility]  [address] [place] [address], [place], [place] [phone]   [facility] [address] [place], [place] [phone] Admissions: 8am-3pm M-F  [facility] [address],    [place], [place] [phone]   [facility] [address] [place], [place], [place] [phone]   [facility] [address],  [place], [place] [phone]   [facility] - Intensive Outpatient [address] Laurell Josephs [address], [place], [place]  [phone]   [facility] ([facility] [address] [place]  [place], [place] [phone] or [phone]   [facility] ([facility]) [address] [place], [place] [phone] Accepts Medicaid  [facility] [address],  [place] [phone] Substance Abuse/Addiction Treatment   [facility] Organization         Address  Phone  Notes  [facility]  [phone]   Angie Fava, PhD [address] Ervin Knack [place], [place]   [phone] or [phone]   [facility]   [address] [place], [place] [phone]   [facility] [address], [place], [place] [phone] Insurance/Medicaid/sponsorship through [facility] and [facility] [address]                                    [place], [place] [phone] Therapy/tele-psych/case  [facility] [address] [place] [address] [place], [place] [phone]    Dr. Lolly Mustache  [phone]   [facility]  [employer] [facility] Dept. 1) [address] [place] 2) [address], [place] 3)  [place], [place] [phone]  [phone]   [facility] Child Abuse Hotline 604-544-7534 or [phone] (After Hours)

## 2015-08-21 NOTE — ED Provider Notes (Signed)
CSN: [id number]     Arrival date & time 08/21/15  1758 History   First MD Initiated Contact with Patient 08/21/15 2025     Chief Complaint  Patient presents with  . Abscess     (Consider location/radiation/quality/duration/timing/severity/associated sxs/prior Treatment) Patient is a 34 y.o. female presenting with abscess. The history is provided by the patient and medical records. No language interpreter was used.  Abscess Associated symptoms: no fever, no nausea and no vomiting      Taylor Allen is a 34 y.o. female  with a hx of retention, migraine, thyroid disease, depression presents to the Emergency Department complaining of gradual, persistent, progressively worsening swelling, pain and redness to the left posterior thigh onset several days ago. Patient reports that she has been getting "blisters" which she describes as red bumps with a white head which rupture, leak pus and then resolve. She reports this has been happening for several weeks. She reports the 2 sites on her left posterior thigh have not followed the same pattern and have continued to worsen in size and pain. No drainage from either site. Treatments prior to arrival. Patient denies known MRSA infection.  Nothing makes her symptoms better or worse. She denies fever, chills, headache or neck pain, chest pain short of breath, abdominal pain, nausea, vomiting.  Past Medical History  Diagnosis Date  . Hypertension   . Migraine   . Thyroid disease   . Depression    Past Surgical History  Procedure Laterality Date  . Cholecystectomy     No family history on file. Social History  Substance Use Topics  . Smoking status: Current Every Day Smoker -- 0.50 packs/day    Types: Cigarettes  . Smokeless tobacco: None  . Alcohol Use: No   OB History    No data available     Review of Systems  Constitutional: Negative for fever and chills.  Respiratory: Negative for shortness of breath.   Cardiovascular: Negative for chest  pain.  Gastrointestinal: Negative for nausea and vomiting.  Endocrine: Negative for polydipsia, polyphagia and polyuria.  Skin:       Abscess  Allergic/Immunologic: Negative for immunocompromised state.  Hematological: Does not bruise/bleed easily.  Psychiatric/Behavioral: The patient is not nervous/anxious.       Allergies  Bactrim  Home Medications   Prior to Admission medications   Medication Sig Start Date End Date Taking? Authorizing Provider  Citalopram Hydrobromide (CELEXA PO) Take 20 mg by mouth.    Yes Historical Provider, MD  LEVOTHYROXINE SODIUM PO Take by mouth.   Yes Historical Provider, MD  LISINOPRIL PO Take 25 mg by mouth.    Yes Historical Provider, MD  topiramate (TOPAMAX) 50 MG tablet Take 50 mg by mouth 2 (two) times daily.   Yes Historical Provider, MD  clindamycin (CLEOCIN) 150 MG capsule Take 3 capsules (450 mg total) by mouth 3 (three) times daily. 08/21/15   Norabelle Kondo, PA-C  naproxen (NAPROSYN) 500 MG tablet Take 1 tablet (500 mg total) by mouth 2 (two) times daily. 06/14/15   [name], MD  predniSONE (DELTASONE) 10 MG tablet Take 2 tablets (20 mg total) by mouth daily. 02/12/15   Lorre Nick, MD  VERAPAMIL HCL PO Take 25 mg by mouth.     Historical Provider, MD   BP 128/81 mmHg  Pulse 96  Temp(Src) 98.2 F (36.8 C) (Oral)  Resp 18  Ht  (1.676 m)  Wt 272 lb (123.378 kg)  BMI 43.92 kg/m2  SpO2  100%  LMP 08/11/2015 Physical Exam  Constitutional: She is oriented to person, place, and time. She appears well-developed and well-nourished. No distress.  HENT:  Head: Normocephalic and atraumatic.  Eyes: Conjunctivae are normal. No scleral icterus.  Neck: Normal range of motion.  Cardiovascular: Normal rate, regular rhythm, normal heart sounds and intact distal pulses.   No murmur heard. Pulmonary/Chest: Effort normal and breath sounds normal.  Abdominal: Soft. She exhibits no distension. There is no tenderness.  Lymphadenopathy:     She has no cervical adenopathy.  Neurological: She is alert and oriented to person, place, and time.  Skin: Skin is warm and dry. She is not diaphoretic. There is erythema.  Left posterior thigh - 2 sites with erythema, induration and central ulceration but no palpable fluctuance coalescing to a total of 11 cm Right abdominal wall - small 2 x 2 centimeter area of erythema and induration without central fluctuance  Psychiatric: She has a normal mood and affect.  Nursing note and vitals reviewed.   ED Course  Procedures (including critical care time) Labs Review Labs Reviewed  CBG MONITORING, ED    Imaging Review No results found. I have personally reviewed and evaluated these images and lab results as part of my medical decision-making.   EKG Interpretation None       EMERGENCY DEPARTMENT US SOFT TISSUE INTERPRETATION "Study: Limited Ultrasound of the noted body part in comments below"  INDICATIONS: Pain and Soft tissue infection Multiple views of the body part are obtained with a multi-frequency linear probe  PERFORMED BY:  Myself  IMAGES ARCHIVED?: Yes  SIDE:Left  BODY PART:Other soft tisse (comment in note) - thigh  FINDINGS: No abcess noted and Cellulitis present  LIMITATIONS:  Body Habitus  INTERPRETATION:  No abcess noted and Cellulitis present  COMMENT:  2 areas of cellulitis without discrete abscess   EMERGENCY DEPARTMENT US SOFT TISSUE INTERPRETATION "Study: Limited Ultrasound of the noted body part in comments below"  INDICATIONS: Pain and Soft tissue infection Multiple views of the body part are obtained with a multi-frequency linear probe  PERFORMED BY:  Myself  IMAGES ARCHIVED?: Yes  SIDE:Right   BODY PART:Abdominal wall  FINDINGS: No abcess noted and Cellulitis present  LIMITATIONS:  Body Habitus  INTERPRETATION:  No abcess noted and Cellulitis present  COMMENT:  One small area of cellulitis without discrete abscess    MDM    Final diagnoses:  Cellulitis of left lower extremity  Cellulitis of abdominal wall    Taylor Allen presents with cellulitis of the left posterior leg and right abdominal wall.  No fever, tachycardia or hypotension.  Pt is without risk factors for HIV; no recent use of steroids or other immunosuppressive medications; no Hx of diabetes and CBG is 81 today.  Pt is without gross abscess for which I&D would be possible and this was confirmed by ultrasound.  Area marked and pt encouraged to return if redness begins to streak, extends beyond the markings, and/or fever or nausea/vomiting develop.  Pt is alert, oriented, NAD, afebrile, non tachycardic, nonseptic and nontoxic appearing.  Pt to be d/c on oral antibiotics with strict f/u instructions.  Concern for potential MRSA infection.   Dahlia Client Ciro Tashiro, PA-C 08/21/15 2149  Courteney Randall An, MD 08/22/15 1501

## 2015-08-21 NOTE — ED Notes (Signed)
Pt verbalizes understanding of d/c instructions and denies any further needs at this time. 

## 2015-08-21 NOTE — ED Notes (Signed)
PA at bedside.

## 2016-04-03 ENCOUNTER — Emergency Department (HOSPITAL_BASED_OUTPATIENT_CLINIC_OR_DEPARTMENT_OTHER)
Admission: EM | Admit: 2016-04-03 | Discharge: 2016-04-03 | Disposition: A | Discharge status: Home / Self Care | Payer: Medicaid Other | Attending: Emergency Medicine | Admitting: Emergency Medicine

## 2016-04-03 ENCOUNTER — Encounter (HOSPITAL_BASED_OUTPATIENT_CLINIC_OR_DEPARTMENT_OTHER): Payer: Self-pay | Admitting: *Deleted

## 2016-04-03 DIAGNOSIS — R110 Nausea: Secondary | ICD-10-CM | POA: Insufficient documentation

## 2016-04-03 DIAGNOSIS — Z792 Long term (current) use of antibiotics: Secondary | ICD-10-CM | POA: Insufficient documentation

## 2016-04-03 DIAGNOSIS — I10 Essential (primary) hypertension: Secondary | ICD-10-CM | POA: Diagnosis not present

## 2016-04-03 DIAGNOSIS — G43909 Migraine, unspecified, not intractable, without status migrainosus: Secondary | ICD-10-CM | POA: Diagnosis not present

## 2016-04-03 DIAGNOSIS — Z791 Long term (current) use of non-steroidal anti-inflammatories (NSAID): Secondary | ICD-10-CM | POA: Insufficient documentation

## 2016-04-03 DIAGNOSIS — L02211 Cutaneous abscess of abdominal wall: Secondary | ICD-10-CM | POA: Diagnosis present

## 2016-04-03 DIAGNOSIS — F17210 Nicotine dependence, cigarettes, uncomplicated: Secondary | ICD-10-CM | POA: Insufficient documentation

## 2016-04-03 DIAGNOSIS — Z79899 Other long term (current) drug therapy: Secondary | ICD-10-CM | POA: Insufficient documentation

## 2016-04-03 DIAGNOSIS — L03311 Cellulitis of abdominal wall: Secondary | ICD-10-CM | POA: Diagnosis not present

## 2016-04-03 DIAGNOSIS — F329 Major depressive disorder, single episode, unspecified: Secondary | ICD-10-CM | POA: Diagnosis not present

## 2016-04-03 DIAGNOSIS — E079 Disorder of thyroid, unspecified: Secondary | ICD-10-CM | POA: Diagnosis not present

## 2016-04-03 DIAGNOSIS — Z7952 Long term (current) use of systemic steroids: Secondary | ICD-10-CM | POA: Diagnosis not present

## 2016-04-03 MED ORDER — DOXYCYCLINE HYCLATE 100 MG PO CAPS: 100.0000 mg | ORAL_CAPSULE | Freq: Two times a day (BID) | ORAL | Status: DC

## 2016-04-03 NOTE — ED Provider Notes (Signed)
CSN: [id number]     Arrival date & time 04/03/16  1402 History   First MD Initiated Contact with Patient 04/03/16 1652     Chief Complaint  Patient presents with  . Abscess     (Consider location/radiation/quality/duration/timing/severity/associated sxs/prior Treatment) Patient is a 35 y.o. female presenting with abscess. The history is provided by the patient. No language interpreter was used.  Abscess Location:  Torso Torso abscess location:  Abd RLQ Abscess quality: draining, induration, painful, redness and warmth   Duration:  4 days Progression:  Worsening Pain details:    Severity:  Moderate Context: not diabetes, not immunosuppression and not skin injury   Associated symptoms: nausea   Associated symptoms: no vomiting   Risk factors: prior abscess     Past Medical History  Diagnosis Date  . Hypertension   . Migraine   . Thyroid disease   . Depression    Past Surgical History  Procedure Laterality Date  . Cholecystectomy     No family history on file. Social History  Substance Use Topics  . Smoking status: Current Every Day Smoker -- 0.50 packs/day    Types: Cigarettes  . Smokeless tobacco: None  . Alcohol Use: No   OB History    No data available     Review of Systems  Gastrointestinal: Positive for nausea. Negative for vomiting.  Skin:       Abscess RLQ.  All other systems reviewed and are negative.     Allergies  Bactrim  Home Medications   Prior to Admission medications   Medication Sig Start Date End Date Taking? Authorizing Provider  Citalopram Hydrobromide (CELEXA PO) Take 20 mg by mouth.     Historical Provider, MD  clindamycin (CLEOCIN) 150 MG capsule Take 3 capsules (450 mg total) by mouth 3 (three) times daily. 08/21/15   Hannah Muthersbaugh, PA-C  LEVOTHYROXINE SODIUM PO Take by mouth.    Historical Provider, MD  LISINOPRIL PO Take 25 mg by mouth.     Historical Provider, MD  naproxen (NAPROSYN) 500 MG tablet Take 1 tablet (500 mg  total) by mouth 2 (two) times daily. 06/14/15   [name], MD  predniSONE (DELTASONE) 10 MG tablet Take 2 tablets (20 mg total) by mouth daily. 02/12/15   Lorre Nick, MD  topiramate (TOPAMAX) 50 MG tablet Take 50 mg by mouth 2 (two) times daily.    Historical Provider, MD  VERAPAMIL HCL PO Take 25 mg by mouth.     Historical Provider, MD   BP 148/107 mmHg  Pulse 96  Temp(Src) 98.4 F (36.9 C) (Oral)  Resp 18  Ht  (1.676 m)  Wt 123.378 kg  BMI 43.92 kg/m2  SpO2 100%  LMP 03/31/2016 Physical Exam  Constitutional: She is oriented to person, place, and time. She appears well-developed and well-nourished.  HENT:  Head: Normocephalic.  Eyes: Conjunctivae are normal.  Neck: Neck supple.  Cardiovascular: Normal rate and regular rhythm.   Pulmonary/Chest: Effort normal and breath sounds normal.  Abdominal: Soft. There is tenderness.  Musculoskeletal: She exhibits no edema or tenderness.  Lymphadenopathy:    She has no cervical adenopathy.  Neurological: She is alert and oriented to person, place, and time.  Skin: Skin is warm and dry.  Psychiatric: She has a normal mood and affect.  Nursing note and vitals reviewed.   ED Course  Procedures (including critical care time)   Labs Review Labs Reviewed - No data to display  Imaging Review No results found.  I have personally reviewed and evaluated these images and lab results as part of my medical decision-making.   EKG Interpretation None     Bedside ultrasound performed. No drainable abscess noted. MDM   Final diagnoses:  None  Patient presentation consistent with cellulitis. No drainable abscess identified on ultrasound. Afebrile. No tachycardia, hypotension or other symptoms suggestive of severe infection. Area has been demarcated and pt advised to follow up for wound check in 2-3 days, sooner for worsening systemic symptoms, new lymphangitis, or significant spread of erythema past line of demarcation. Will  discharge with doxycyline. Return precautions discussed. Pt appears safe for discharge.       Felicie Mornavid Anisa Leanos, NP 04/04/16 [id number]  Linwood DibblesJon Knapp, MD 04/05/16 [phone]

## 2016-04-03 NOTE — ED Notes (Signed)
NP at bedside.

## 2016-04-03 NOTE — ED Notes (Signed)
States she has an abscess on her abdomen. Large amount of redness to her lower abdomen. Hot to touch, painful and small opening with small amount of clear drainage.

## 2016-04-03 NOTE — Discharge Instructions (Signed)

## 2016-08-22 IMAGING — CR DG CHEST 2V
2 series · 2 of 2 positions shown · non-contrast
Comparison: Chest x-ray 01/25/2015.

CLINICAL DATA: 34-year-old female with wound woke up with
right-sided chest pain today. Pain increases with deep inspiration
and with movement.

EXAM:
CHEST  2 VIEW

[w chest pa]
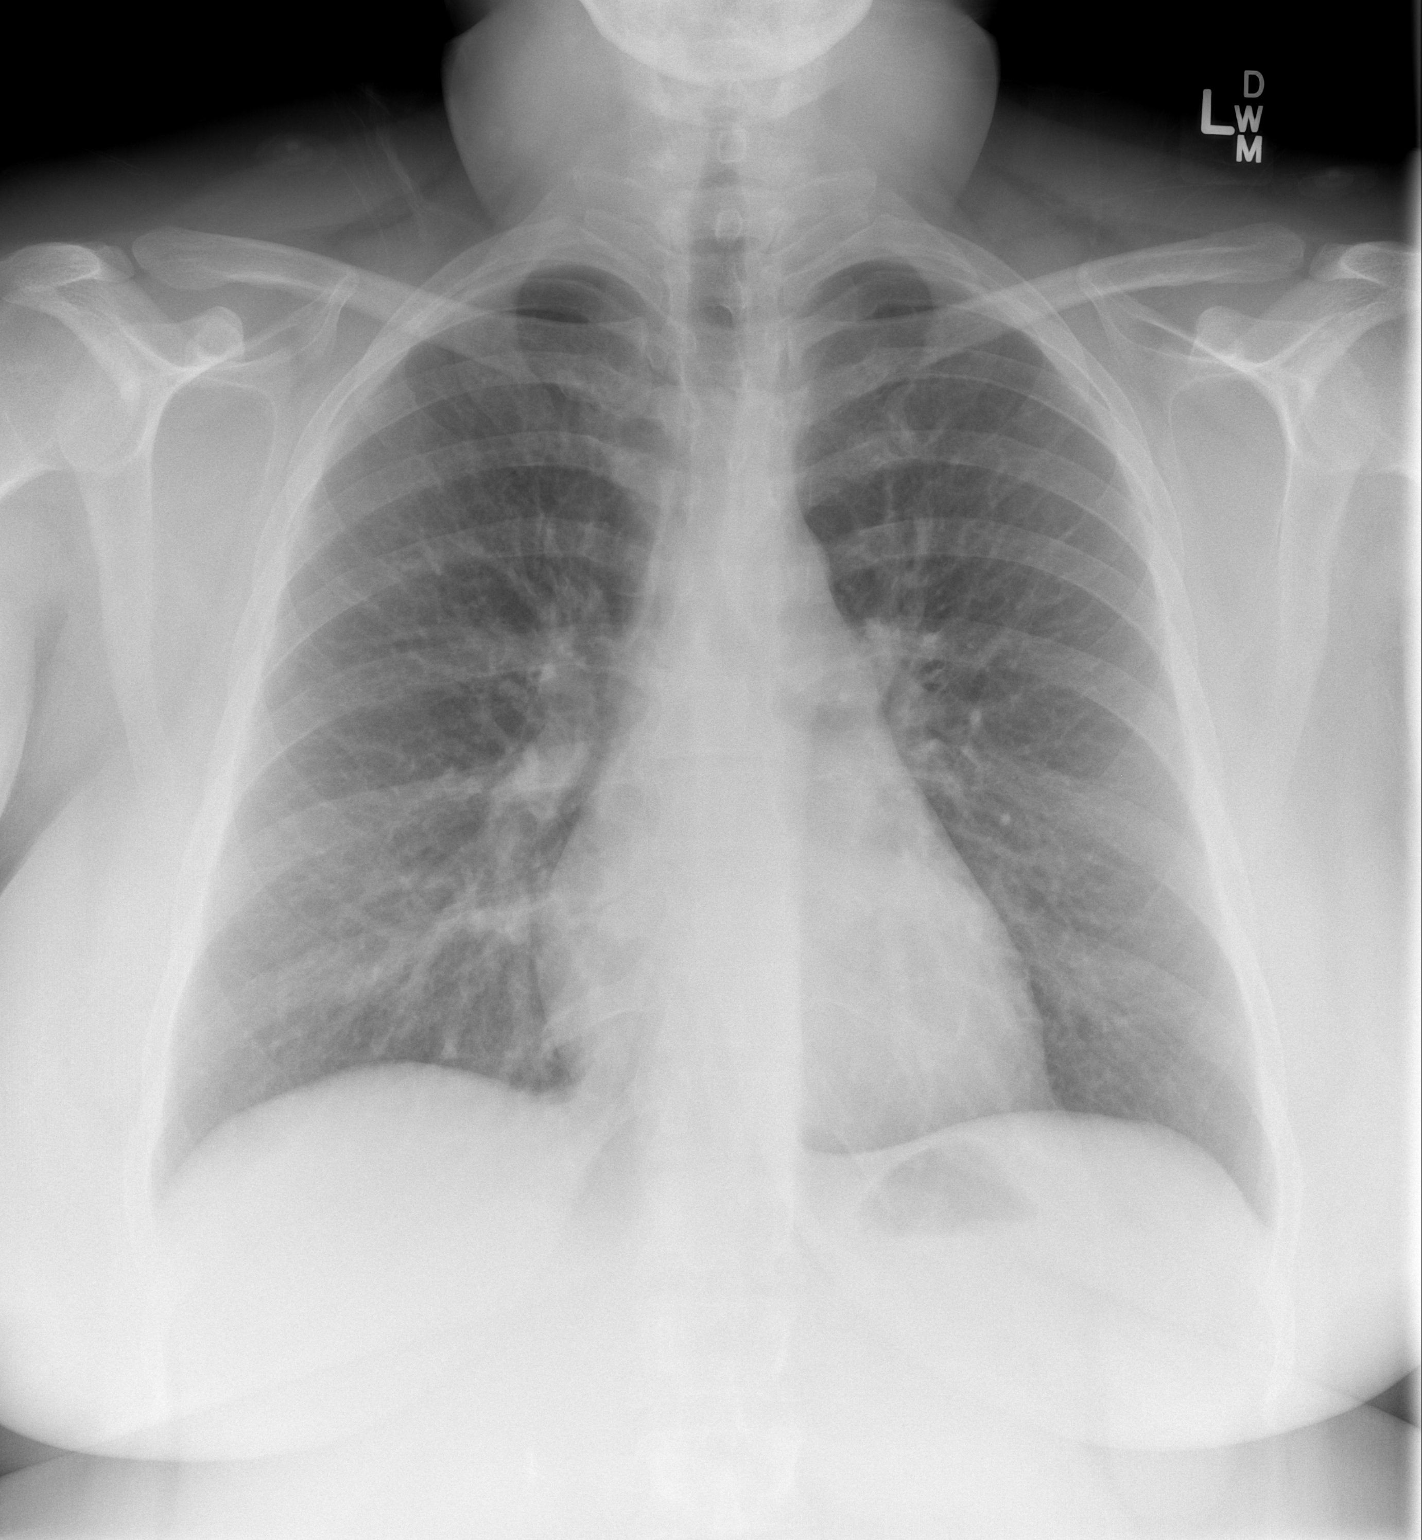

[w chest lat *]
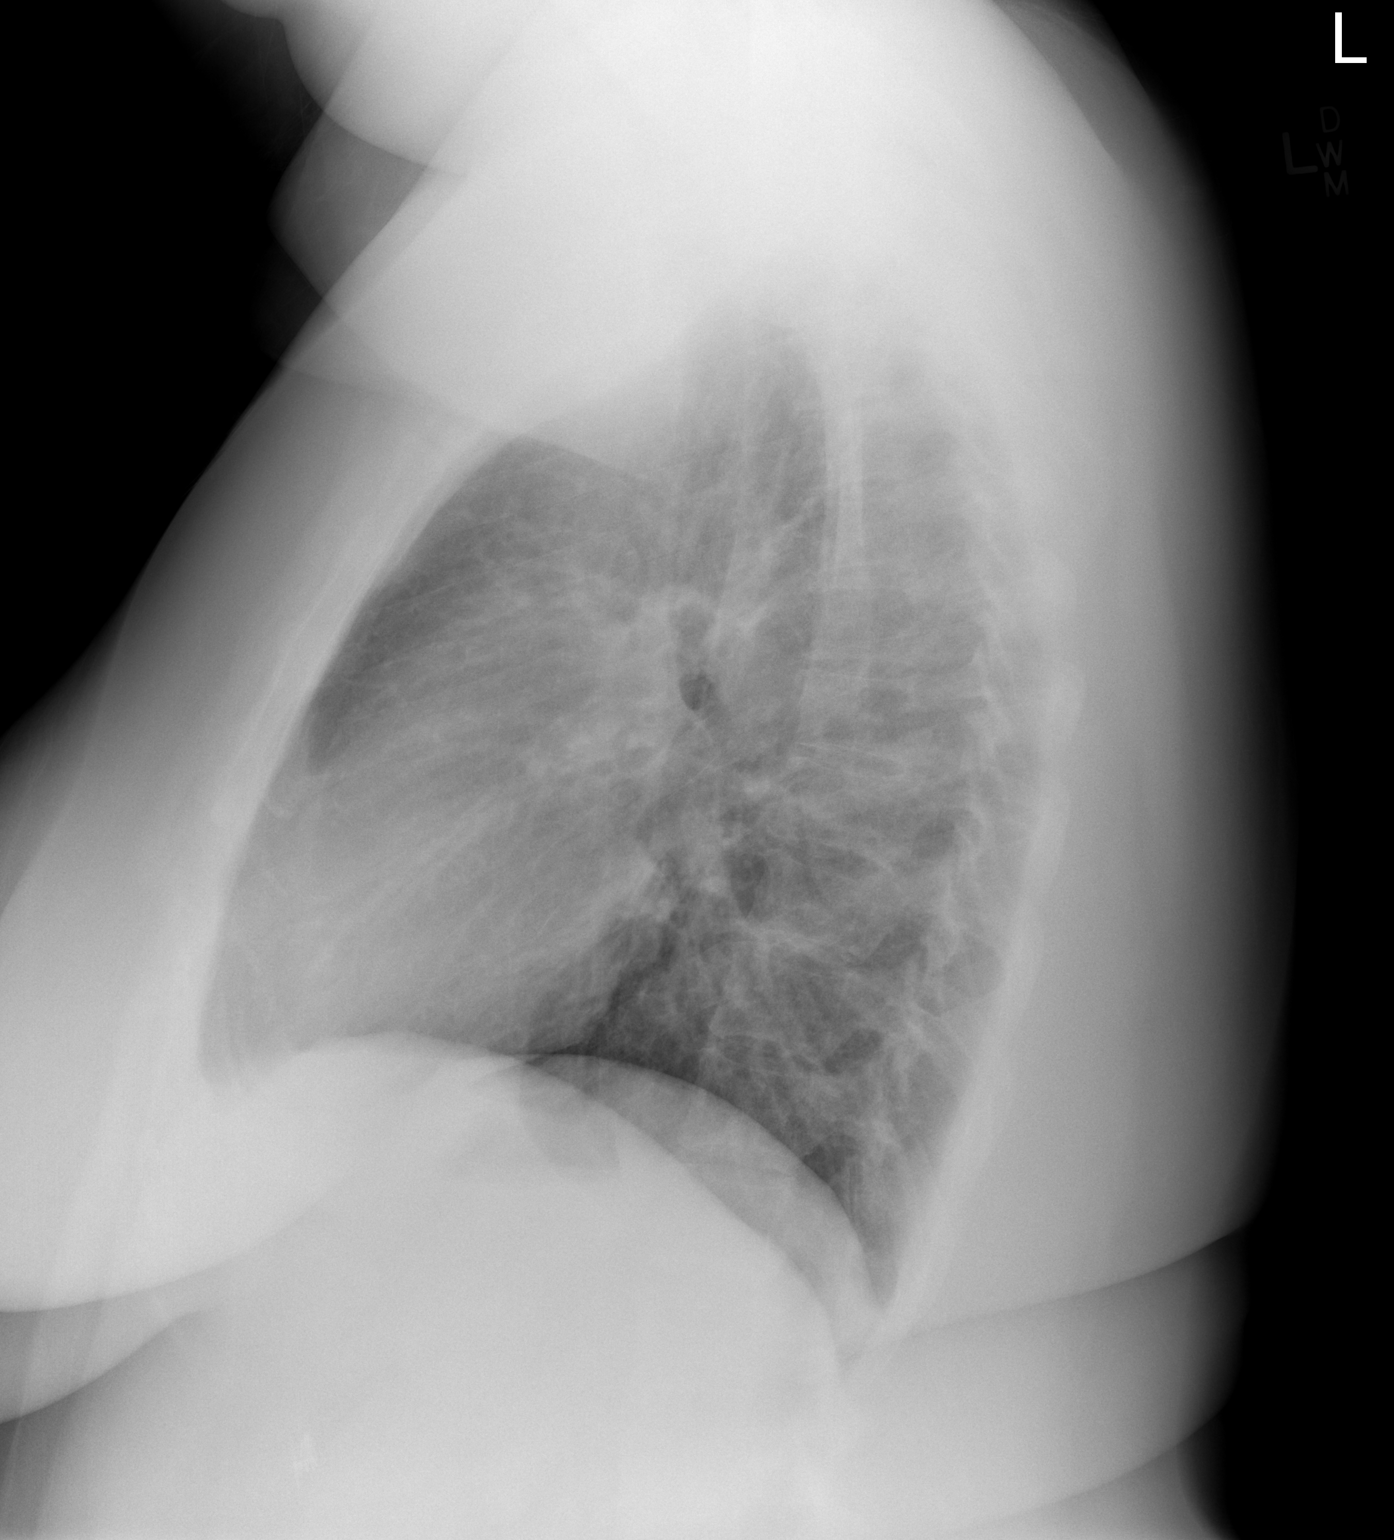

[2 of 2 positions shown; findings below may reference images not displayed]

FINDINGS: Lung volumes are normal. No consolidative airspace disease. No
pleural effusions. No pneumothorax. No pulmonary nodule or mass
noted. Pulmonary vasculature and the cardiomediastinal silhouette
are within normal limits.
IMPRESSION: No radiographic evidence of acute cardiopulmonary disease.

## 2016-10-04 HISTORY — PX: ENDOMETRIAL ABLATION: SHX621

## 2016-10-06 ENCOUNTER — Encounter (HOSPITAL_BASED_OUTPATIENT_CLINIC_OR_DEPARTMENT_OTHER): Payer: Self-pay | Admitting: Emergency Medicine

## 2016-10-06 ENCOUNTER — Emergency Department (HOSPITAL_BASED_OUTPATIENT_CLINIC_OR_DEPARTMENT_OTHER)
Admission: EM | Admit: 2016-10-06 | Discharge: 2016-10-06 | Disposition: A | Discharge status: Home / Self Care | Payer: Medicaid Other | Attending: Emergency Medicine | Admitting: Emergency Medicine

## 2016-10-06 DIAGNOSIS — F17210 Nicotine dependence, cigarettes, uncomplicated: Secondary | ICD-10-CM | POA: Diagnosis not present

## 2016-10-06 DIAGNOSIS — B029 Zoster without complications: Secondary | ICD-10-CM | POA: Insufficient documentation

## 2016-10-06 DIAGNOSIS — I10 Essential (primary) hypertension: Secondary | ICD-10-CM | POA: Insufficient documentation

## 2016-10-06 DIAGNOSIS — Z79899 Other long term (current) drug therapy: Secondary | ICD-10-CM | POA: Diagnosis not present

## 2016-10-06 DIAGNOSIS — R21 Rash and other nonspecific skin eruption: Secondary | ICD-10-CM | POA: Diagnosis present

## 2016-10-06 MED ORDER — PREDNISONE 50 MG PO TABS
60.0000 mg | ORAL_TABLET | Freq: Once | ORAL | Status: AC
  Administered 2016-10-06: 60 mg via ORAL
  Filled 2016-10-06: qty 1

## 2016-10-06 MED ORDER — VALACYCLOVIR HCL 1 G PO TABS: 1000.0000 mg | ORAL_TABLET | Freq: Three times a day (TID) | ORAL | 0 refills | Status: DC

## 2016-10-06 MED FILL — valACYclovir HCL 1 GM TABS: 1 | 7 days supply | Qty: 21 | Fill #0

## 2016-10-06 NOTE — ED Provider Notes (Signed)
Pt seen and evaluated.  D/W Dr. Wende MottMcKeag.  Patient reports a lesion on her right lower face adjacent her lip for the last several days. Also some pain and tingling posteriorly along the ramus of her mandible. Exam shows vesicular lesions on erythematous base. These extend inferior and lateral to the vermilion border. She has some skin sensitivity posterior lung ramus the mandible. She has impressive enlarged some mandibular and some mental lymph nodes. Normal oral mucosa. Normal posterior pharynx. Differential diagnosis would include HSV-1, versus V3 distribution zoster. I think very likely shingles. We'll give full dose Valtrex. Single dose prednisone here. Primary care follow-up.   Rolland PorterMark Ronnie Mallette, MD 10/06/16 1012

## 2016-10-06 NOTE — ED Triage Notes (Signed)
Pt reports that she had an outpatient procedure on Tuesday and was given percocet for pain. After which blisters began on right side of mouth. Reports swelling in right jaw and pain in teeth and bilateral neck. Last does of percocet 0200 10/11. Has taken Benadryl yesterday with no relief. Denies difficulty swallowing and is speaking in clear sentences.

## 2016-10-06 NOTE — ED Notes (Signed)
MD at bedside. 

## 2016-10-06 NOTE — ED Provider Notes (Signed)
[facility] Provider [facility]   CSN: [id number] Arrival date & time: 10/06/16  0902     History   Chief Complaint Chief Complaint  Patient presents with  . Allergic Reaction  . Jaw Pain  . Rash    HPI Taylor Allen is a 35 y.o. female with complaints of a right-sided facial rash and pain 3 days. Patient states she underwent an outpatient OB/GYN procedure on 10/03/16. Later that night she noticed some pain and swelling along the right side of her lower lip. Pain is described as sharp and "tingly". Since that time it has progressively gotten worse and produced some clear blisters. She initially thought this was due to a medication reaction (she was prescribed Percocet after the procedure) surgery to Benadryl without any improvement. She denies any fevers or chills. No previous episodes of similarity. No new sexual partners. She states that she received an epidural for the procedure and thus was not intubated. She presented to her today because her symptoms continued to worsen, and the pain has not gotten any better.  HPI  Past Medical History:  Diagnosis Date  . Depression   . Hypertension   . Migraine   . Thyroid disease     There are no active problems to display for this patient.   Past Surgical History:  Procedure Laterality Date  . CHOLECYSTECTOMY    . ENDOMETRIAL ABLATION  10/04/2016    OB History    No data available       Home Medications    Prior to Admission medications   Medication Sig Start Date End Date Taking? Authorizing Provider  Citalopram Hydrobromide (CELEXA PO) Take 20 mg by mouth.    Yes Historical Provider, MD  LEVOTHYROXINE SODIUM PO Take by mouth.   Yes Historical Provider, MD  oxyCODONE-acetaminophen (PERCOCET/ROXICET) 5-325 MG tablet Take 1 tablet by mouth every 4 (four) hours as needed for severe pain.   Yes Historical Provider, MD  topiramate (TOPAMAX) 50 MG tablet Take 50 mg by mouth 2 (two) times daily.   Yes Historical  Provider, MD  clindamycin (CLEOCIN) 150 MG capsule Take 3 capsules (450 mg total) by mouth 3 (three) times daily. 08/21/15   Hannah Muthersbaugh, PA-C  doxycycline (VIBRAMYCIN) 100 MG capsule Take 1 capsule (100 mg total) by mouth 2 (two) times daily. One po bid x 7 days 04/03/16   Felicie Mornavid Smith, NP  LISINOPRIL PO Take 25 mg by mouth.     Historical Provider, MD  naproxen (NAPROSYN) 500 MG tablet Take 1 tablet (500 mg total) by mouth 2 (two) times daily. 06/14/15   Gwyneth SproutWhitney Plunkett, MD  predniSONE (DELTASONE) 10 MG tablet Take 2 tablets (20 mg total) by mouth daily. 02/12/15   Lorre NickAnthony Allen, MD  VERAPAMIL HCL PO Take 25 mg by mouth.     Historical Provider, MD    Family History No family history on file.  Social History Social History  Substance Use Topics  . Smoking status: Current Every Day Smoker    Packs/day: 0.50    Types: Cigarettes  . Smokeless tobacco: Never Used  . Alcohol use No     Allergies   Bactrim [sulfamethoxazole-trimethoprim]   Review of Systems Review of Systems  Constitutional: Negative for activity change, appetite change, chills, diaphoresis, fatigue and fever.  HENT: Positive for facial swelling. Negative for congestion, dental problem, drooling, ear pain, hearing loss, mouth sores, rhinorrhea, sinus pressure, sore throat, tinnitus, trouble swallowing and voice change.   Eyes: Negative for  pain, discharge, redness, itching and visual disturbance.  Respiratory: Negative for cough, chest tightness and shortness of breath.   Cardiovascular: Negative for chest pain.  Gastrointestinal: Negative for abdominal pain, diarrhea, nausea and vomiting.  Endocrine: Negative for polydipsia.  Genitourinary: Negative for dysuria, flank pain and genital sores.  Musculoskeletal: Negative for neck pain and neck stiffness.  Skin: Positive for rash.  Neurological: Negative for facial asymmetry, weakness, light-headedness, numbness and headaches.     Physical Exam Updated Vital  Signs BP 139/82 (BP Location: Right Arm)   Pulse 80   Temp 99.1 F (37.3 C) (Oral)   Resp 18   Ht 5\' 6"  (1.676 m)   Wt 118.4 kg   SpO2 100%   BMI 42.13 kg/m   Physical Exam  Constitutional: She is oriented to person, place, and time. She appears well-developed and well-nourished. No distress.  HENT:  Head: Normocephalic and atraumatic.  Right Ear: External ear normal.  Left Ear: External ear normal.  Nose: Nose normal.  Mouth/Throat: Oropharynx is clear and moist.    Eyes: Conjunctivae and EOM are normal. Pupils are equal, round, and reactive to light.  Neck: Normal range of motion. Neck supple.  Cardiovascular: Normal rate, regular rhythm and normal heart sounds.   No murmur heard. Pulmonary/Chest: Effort normal and breath sounds normal.  Abdominal: Soft. Bowel sounds are normal.  Musculoskeletal: Normal range of motion.  Lymphadenopathy:    She has cervical adenopathy.  Neurological: She is alert and oriented to person, place, and time.  Skin: Skin is warm and dry. Capillary refill takes less than 2 seconds. Rash noted. Rash is vesicular. She is not diaphoretic.     Psychiatric: She has a normal mood and affect. Her behavior is normal.     ED Treatments / Results  Labs (all labs ordered are listed, but only abnormal results are displayed) Labs Reviewed - No data to display  EKG  EKG Interpretation None       Radiology No results found.  Procedures Procedures (including critical care time)  Medications Ordered in ED Medications - No data to display   Initial Impression / Assessment and Plan / ED Course  I have reviewed the triage vital signs and the nursing notes.  Pertinent labs & imaging results that were available during my care of the patient were reviewed by me and considered in my medical decision making (see chart for details).  Clinical Course   Shingles:  Patient was evaluated and rash presentation is most consistent with shingles.  Differential includes HSV-1. Rash involves the mucous membrane of the right lower lip and extends laterally. The pain radiates up the skin towards the zygomatic arch and is described with neuropathic characteristics. Patient provided one dose of prednisone 60 mg. Prescription for valacyclovir 1000 mg 3 times a day 7 days. She was deemed stable for discharge and asked to f/u with her PCP.   Final Clinical Impressions(s) / ED Diagnoses   Final diagnoses:  None    New Prescriptions New Prescriptions   No medications on file     Kathee Delton, MD 10/06/16 1004    Rolland Porter, MD 10/19/16 [phone]

## 2017-03-03 ENCOUNTER — Emergency Department (HOSPITAL_BASED_OUTPATIENT_CLINIC_OR_DEPARTMENT_OTHER)
Admission: EM | Admit: 2017-03-03 | Discharge: 2017-03-03 | Disposition: A | Discharge status: Home / Self Care | Payer: Medicaid Other | Attending: Emergency Medicine | Admitting: Emergency Medicine

## 2017-03-03 ENCOUNTER — Encounter (HOSPITAL_BASED_OUTPATIENT_CLINIC_OR_DEPARTMENT_OTHER): Payer: Self-pay | Admitting: Emergency Medicine

## 2017-03-03 DIAGNOSIS — K047 Periapical abscess without sinus: Secondary | ICD-10-CM

## 2017-03-03 DIAGNOSIS — I10 Essential (primary) hypertension: Secondary | ICD-10-CM | POA: Insufficient documentation

## 2017-03-03 DIAGNOSIS — L03211 Cellulitis of face: Secondary | ICD-10-CM | POA: Insufficient documentation

## 2017-03-03 DIAGNOSIS — R220 Localized swelling, mass and lump, head: Secondary | ICD-10-CM | POA: Diagnosis present

## 2017-03-03 DIAGNOSIS — F17210 Nicotine dependence, cigarettes, uncomplicated: Secondary | ICD-10-CM | POA: Insufficient documentation

## 2017-03-03 MED ORDER — IBUPROFEN 600 MG PO TABS: 600.0000 mg | ORAL_TABLET | Freq: Three times a day (TID) | ORAL | 0 refills | Status: AC | PRN

## 2017-03-03 MED ORDER — AMOXICILLIN-POT CLAVULANATE 875-125 MG PO TABS: 1.0000 | ORAL_TABLET | Freq: Two times a day (BID) | ORAL | 0 refills | Status: DC

## 2017-03-03 NOTE — ED Triage Notes (Signed)
Pt reports crack in left upper tooth without pain but started having facial swelling last night

## 2017-03-03 NOTE — ED Provider Notes (Signed)
[facility] Provider [facility]   CSN: [id number] Arrival date & time: 03/03/17  1202     History   Chief Complaint Chief Complaint  Patient presents with  . Facial Swelling    HPI Taylor Allen is a 36 y.o. female.  HPI  36 year old female presents with left mandibular facial swelling starting last night. She's had moderate pain and facial swelling. Has not worsened since last night. Over the last year she has also noted some maxillary tooth pain. Denies any tooth swelling. She describes as moderate. No fevers but her face is felt a little warm. No trouble swallowing or breathing.  Past Medical History:  Diagnosis Date  . Depression   . Hypertension   . Migraine   . Thyroid disease     There are no active problems to display for this patient.   Past Surgical History:  Procedure Laterality Date  . CHOLECYSTECTOMY    . ENDOMETRIAL ABLATION  10/04/2016    OB History    No data available       Home Medications    Prior to Admission medications   Medication Sig Start Date End Date Taking? Authorizing Provider  amoxicillin-clavulanate (AUGMENTIN) 875-125 MG tablet Take 1 tablet by mouth 2 (two) times daily. One po bid x 7 days 03/03/17   Pricilla LovelessScott Ronda Rajkumar, MD  Citalopram Hydrobromide (CELEXA PO) Take 20 mg by mouth.     Historical Provider, MD  clindamycin (CLEOCIN) 150 MG capsule Take 3 capsules (450 mg total) by mouth 3 (three) times daily. 08/21/15   Hannah Muthersbaugh, PA-C  doxycycline (VIBRAMYCIN) 100 MG capsule Take 1 capsule (100 mg total) by mouth 2 (two) times daily. One po bid x 7 days 04/03/16   Felicie Mornavid Smith, NP  ibuprofen (ADVIL,MOTRIN) 600 MG tablet Take 1 tablet (600 mg total) by mouth every 8 (eight) hours as needed. 03/03/17   Pricilla LovelessScott Janaria Mccammon, MD  LEVOTHYROXINE SODIUM PO Take by mouth.    Historical Provider, MD  LISINOPRIL PO Take 25 mg by mouth.     Historical Provider, MD  naproxen (NAPROSYN) 500 MG tablet Take 1 tablet (500 mg total) by mouth 2  (two) times daily. 06/14/15   Gwyneth SproutWhitney Plunkett, MD  oxyCODONE-acetaminophen (PERCOCET/ROXICET) 5-325 MG tablet Take 1 tablet by mouth every 4 (four) hours as needed for severe pain.    Historical Provider, MD  predniSONE (DELTASONE) 10 MG tablet Take 2 tablets (20 mg total) by mouth daily. 02/12/15   Lorre NickAnthony Allen, MD  topiramate (TOPAMAX) 50 MG tablet Take 50 mg by mouth 2 (two) times daily.    Historical Provider, MD  valACYclovir (VALTREX) 1000 MG tablet Take 1 tablet (1,000 mg total) by mouth 3 (three) times daily. 10/06/16   Kathee DeltonIan D McKeag, MD  VERAPAMIL HCL PO Take 25 mg by mouth.     Historical Provider, MD    Family History History reviewed. No pertinent family history.  Social History Social History  Substance Use Topics  . Smoking status: Current Every Day Smoker    Packs/day: 0.50    Types: Cigarettes  . Smokeless tobacco: Never Used  . Alcohol use No     Allergies   Bactrim [sulfamethoxazole-trimethoprim]   Review of Systems Review of Systems  Constitutional: Negative for fever.  HENT: Positive for dental problem and facial swelling. Negative for sore throat, trouble swallowing and voice change.   Respiratory: Negative for shortness of breath.   Gastrointestinal: Negative for vomiting.  All other systems reviewed and are negative.  Physical Exam Updated Vital Signs BP 143/99   Pulse 94   Temp 99 F (37.2 C) (Oral)   Resp 18   Ht 5\' 6"  (1.676 m)   Wt 277 lb (125.6 kg)   SpO2 100%   BMI 44.71 kg/m   Physical Exam  Constitutional: She is oriented to person, place, and time. She appears well-developed and well-nourished.  HENT:  Head: Normocephalic and atraumatic.    Right Ear: External ear normal.  Left Ear: External ear normal.  Nose: Nose normal.  Mouth/Throat:    Eyes: Right eye exhibits no discharge. Left eye exhibits no discharge.  Cardiovascular: Normal rate, regular rhythm and normal heart sounds.   Pulmonary/Chest: Effort normal and breath  sounds normal. No stridor. She has no wheezes. She has no rales.  Neurological: She is alert and oriented to person, place, and time.  Skin: Skin is warm and dry.  Nursing note and vitals reviewed.    ED Treatments / Results  Labs (all labs ordered are listed, but only abnormal results are displayed) Labs Reviewed - No data to display  EKG  EKG Interpretation None       Radiology No results found.  Procedures Procedures (including critical care time)  Medications Ordered in ED Medications - No data to display   Initial Impression / Assessment and Plan / ED Course  I have reviewed the triage vital signs and the nursing notes.  Pertinent labs & imaging results that were available during my care of the patient were reviewed by me and considered in my medical decision making (see chart for details).     Patient's facial swelling is likely related to a cellulitis/developing abscess. Probably from the tooth. She overall appears quite well. Discussed using NSAIDs and will start on antibiotics. She has a dentist she feels like she can follow-up within the next couple days. Strict return precautions.  Final Clinical Impressions(s) / ED Diagnoses   Final diagnoses:  Facial cellulitis  Dental infection    New Prescriptions New Prescriptions   AMOXICILLIN-CLAVULANATE (AUGMENTIN) 875-125 MG TABLET    Take 1 tablet by mouth 2 (two) times daily. One po bid x 7 days   IBUPROFEN (ADVIL,MOTRIN) 600 MG TABLET    Take 1 tablet (600 mg total) by mouth every 8 (eight) hours as needed.     Pricilla Loveless, MD 03/03/17 [phone]

## 2019-08-10 ENCOUNTER — Other Ambulatory Visit: Payer: Self-pay

## 2019-08-10 ENCOUNTER — Ambulatory Visit (HOSPITAL_COMMUNITY)
Admission: EM | Admit: 2019-08-10 | Discharge: 2019-08-10 | Disposition: A | Discharge status: Home / Self Care | Payer: Medicaid Other | Attending: Family Medicine | Admitting: Family Medicine

## 2019-08-10 ENCOUNTER — Encounter (HOSPITAL_COMMUNITY): Payer: Self-pay | Admitting: Emergency Medicine

## 2019-08-10 DIAGNOSIS — Z9049 Acquired absence of other specified parts of digestive tract: Secondary | ICD-10-CM | POA: Insufficient documentation

## 2019-08-10 DIAGNOSIS — F17210 Nicotine dependence, cigarettes, uncomplicated: Secondary | ICD-10-CM | POA: Diagnosis not present

## 2019-08-10 DIAGNOSIS — Z20828 Contact with and (suspected) exposure to other viral communicable diseases: Secondary | ICD-10-CM | POA: Insufficient documentation

## 2019-08-10 DIAGNOSIS — Z20822 Contact with and (suspected) exposure to covid-19: Secondary | ICD-10-CM

## 2019-08-10 DIAGNOSIS — I10 Essential (primary) hypertension: Secondary | ICD-10-CM | POA: Insufficient documentation

## 2019-08-10 DIAGNOSIS — Z881 Allergy status to other antibiotic agents status: Secondary | ICD-10-CM | POA: Insufficient documentation

## 2019-08-10 DIAGNOSIS — Z79890 Hormone replacement therapy (postmenopausal): Secondary | ICD-10-CM | POA: Insufficient documentation

## 2019-08-10 DIAGNOSIS — E079 Disorder of thyroid, unspecified: Secondary | ICD-10-CM | POA: Diagnosis not present

## 2019-08-10 DIAGNOSIS — Z79899 Other long term (current) drug therapy: Secondary | ICD-10-CM | POA: Diagnosis not present

## 2019-08-10 DIAGNOSIS — J039 Acute tonsillitis, unspecified: Secondary | ICD-10-CM | POA: Insufficient documentation

## 2019-08-10 DIAGNOSIS — F329 Major depressive disorder, single episode, unspecified: Secondary | ICD-10-CM | POA: Insufficient documentation

## 2019-08-10 LAB — POCT RAPID STREP A: Streptococcus, Group A Screen (Direct): NEGATIVE

## 2019-08-10 MED ORDER — CLINDAMYCIN HCL 300 MG PO CAPS: 300.0000 mg | ORAL_CAPSULE | Freq: Three times a day (TID) | ORAL | 0 refills | Status: DC

## 2019-08-10 NOTE — ED Triage Notes (Addendum)
Pt states a few days ago she was dx with strep, was given penicillin, started taking it on Friday. Pt states it is not helping, her tonsils are swollen and now touching, sore throat is getting worse. Pt also requesting covid testing

## 2019-08-10 NOTE — ED Provider Notes (Signed)
[facility]    CSN: [id number] Arrival date & time: 08/10/19  1119      History   Chief Complaint Chief Complaint  Patient presents with  . Sore Throat    HPI Taylor Allen is a 38 y.o. female.   HPI  Recurrent tonsillitis Often strep Was diagnosed by video visit with strep throat and started on PCN 48 hours ago.  Feels she is getting worse.  States has had to take clindamycin before whan PCN did not work NO fever or chills,  No headache or body aches. No known exposure to illnes  Past Medical History:  Diagnosis Date  . Depression   . Hypertension   . Migraine   . Thyroid disease     There are no active problems to display for this patient.   Past Surgical History:  Procedure Laterality Date  . CHOLECYSTECTOMY    . ENDOMETRIAL ABLATION  10/04/2016    OB History   No obstetric history on file.      Home Medications    Prior to Admission medications   Medication Sig Start Date End Date Taking? Authorizing Provider  HYDROCHLOROTHIAZIDE PO Take by mouth.   Yes [provider]  Citalopram Hydrobromide (CELEXA PO) Take 20 mg by mouth.     [provider]  clindamycin (CLEOCIN) 300 MG capsule Take 1 capsule (300 mg total) by mouth 3 (three) times daily. 08/10/19   Eustace MooreNelson, Kermit Arnette Sue, MD  ibuprofen (ADVIL,MOTRIN) 600 MG tablet Take 1 tablet (600 mg total) by mouth every 8 (eight) hours as needed. 03/03/17   Pricilla LovelessGoldston, Scott, MD  LEVOTHYROXINE SODIUM PO Take by mouth.    [provider]  LISINOPRIL PO Take 25 mg by mouth.   08/10/19  [provider]  topiramate (TOPAMAX) 50 MG tablet Take 50 mg by mouth 2 (two) times daily.  08/10/19  [provider]  VERAPAMIL HCL PO Take 25 mg by mouth.   08/10/19  [provider]    Family History No family history on file.  Social History Social History   Tobacco Use  . Smoking status: Current Every Day Smoker    Packs/day: 0.50    Types: Cigarettes  .  Smokeless tobacco: Never Used  Substance Use Topics  . Alcohol use: No  . Drug use: No     Allergies   Bactrim [sulfamethoxazole-trimethoprim]   Review of Systems Review of Systems  Constitutional: Negative for chills and fever.  HENT: Positive for sore throat and trouble swallowing. Negative for ear pain.   Eyes: Negative for pain and visual disturbance.  Respiratory: Negative for cough and shortness of breath.   Cardiovascular: Negative for chest pain and palpitations.  Gastrointestinal: Negative for abdominal pain and vomiting.  Genitourinary: Negative for dysuria and hematuria.  Musculoskeletal: Negative for arthralgias and back pain.  Skin: Negative for color change and rash.  Neurological: Negative for seizures and syncope.  All other systems reviewed and are negative.    Physical Exam Triage Vital Signs ED Triage Vitals  Enc Vitals Group     BP 08/10/19 1237 (!) 158/103     Pulse Rate 08/10/19 1237 84     Resp 08/10/19 1237 14     Temp 08/10/19 1237 98 F (36.7 C)     Temp src --      SpO2 08/10/19 1237 100 %     Weight --      Height --      Head Circumference --  Peak Flow --      Pain Score 08/10/19 1236 3     Pain Loc --      Pain Edu? --      Excl. in GC? --    No data found.  Updated Vital Signs BP (!) 158/103   Pulse 84   Temp 98 F (36.7 C)   Resp 14   LMP 08/10/2019   SpO2 100%      Physical Exam Constitutional:      General: She is not in acute distress.    Appearance: She is well-developed.  HENT:     Head: Normocephalic and atraumatic.     Mouth/Throat:     Pharynx: Uvula midline. Pharyngeal swelling, oropharyngeal exudate, posterior oropharyngeal erythema and uvula swelling present.     Tonsils: Tonsillar exudate present. 3+ on the right. 3+ on the left.  Eyes:     Conjunctiva/sclera: Conjunctivae normal.     Pupils: Pupils are equal, round, and reactive to light.  Neck:     Musculoskeletal: Normal range of motion.   Cardiovascular:     Rate and Rhythm: Normal rate.  Pulmonary:     Effort: Pulmonary effort is normal. No respiratory distress.  Abdominal:     General: There is no distension.     Palpations: Abdomen is soft.  Musculoskeletal: Normal range of motion.  Lymphadenopathy:     Cervical: Cervical adenopathy present.  Skin:    General: Skin is warm and dry.  Neurological:     Mental Status: She is alert.  Psychiatric:        Mood and Affect: Mood normal.        Behavior: Behavior normal.      UC Treatments / Results  Labs (all labs ordered are listed, but only abnormal results are displayed) Labs Reviewed  NOVEL CORONAVIRUS, NAA (HOSPITAL ORDER, SEND-OUT TO REF LAB)  CULTURE, GROUP A STREP [facility])  POCT RAPID STREP A    EKG   Radiology No results found.  Procedures Procedures (including critical care time)  Medications Ordered in UC Medications - No data to display  Initial Impression / Assessment and Plan / UC Course  I have reviewed the triage vital signs and the nursing notes.  Pertinent labs & imaging results that were available during my care of the patient were reviewed by me and considered in my medical decision making (see chart for details).     Partially treated tonsillitis.  Discussed that this may be a virus.  Will send culture at this point.  Patient requests Coronavirus testing.  No known exposure.  Quarantine emphasized Final Clinical Impressions(s) / UC Diagnoses   Final diagnoses:  Tonsillitis  Suspected Covid-19 Virus Infection     Discharge Instructions     Take clindamycin 3 times a day Stop penicillin I have sent a throat culture.  The result will be available in 2 to 3 days.  We will call you if it is positive If your throat culture is negative, you may stop the antibiotics when you feel better.  If your throat culture is positive you need 10 full days of clindamycin I have sent a coronavirus test.  You will be called if this is positive.   Negative tests can be seen on my chart, or you may call the office in 5 days     Person Under Monitoring Name: Taylor Allen  Location: [address] [place] [address]   Infection Prevention Recommendations for Individuals Confirmed to  have, or Being Evaluated for, 2019 Novel Coronavirus (COVID-19) Infection Who Receive Care at Home  Individuals who are confirmed to have, or are being evaluated for, COVID-19 should follow the prevention steps below until a healthcare provider or local or state health department says they can return to normal activities.  Stay home except to get medical care You should restrict activities outside your home, except for getting medical care. Do not go to work, school, or public areas, and do not use public transportation or taxis.  Call ahead before visiting your doctor Before your medical appointment, call the healthcare provider and tell them that you have, or are being evaluated for, COVID-19 infection. This will help the healthcare provider's office take steps to keep other people from getting infected. Ask your healthcare provider to call the local or state health department.  Monitor your symptoms Seek prompt medical attention if your illness is worsening (e.g., difficulty breathing). Before going to your medical appointment, call the healthcare provider and tell them that you have, or are being evaluated for, COVID-19 infection. Ask your healthcare provider to call the local or state health department.  Wear a facemask You should wear a facemask that covers your nose and mouth when you are in the same room with other people and when you visit a healthcare provider. People who live with or visit you should also wear a facemask while they are in the same room with you.  Separate yourself from other people in your home As much as possible, you should stay in a different room from other people in your home. Also, you should use a separate  bathroom, if available.  Avoid sharing household items You should not share dishes, drinking glasses, cups, eating utensils, towels, bedding, or other items with other people in your home. After using these items, you should wash them thoroughly with soap and water.  Cover your coughs and sneezes Cover your mouth and nose with a tissue when you cough or sneeze, or you can cough or sneeze into your sleeve. Throw used tissues in a lined trash can, and immediately wash your hands with soap and water for at least 20 seconds or use an alcohol-based hand rub.  Wash your [facility] your hands often and thoroughly with soap and water for at least 20 seconds. You can use an alcohol-based hand sanitizer if soap and water are not available and if your hands are not visibly dirty. Avoid touching your eyes, nose, and mouth with unwashed hands.   Prevention Steps for Caregivers and Household Members of Individuals Confirmed to have, or Being Evaluated for, COVID-19 Infection Being Cared for in the Home  If you live with, or provide care at home for, a person confirmed to have, or being evaluated for, COVID-19 infection please follow these guidelines to prevent infection:  Follow healthcare provider's instructions Make sure that you understand and can help the patient follow any healthcare provider instructions for all care.  Provide for the patient's basic needs You should help the patient with basic needs in the home and provide support for getting groceries, prescriptions, and other personal needs.  Monitor the patient's symptoms If they are getting sicker, call his or her medical provider and tell them that the patient has, or is being evaluated for, COVID-19 infection. This will help the healthcare provider's office take steps to keep other people from getting infected. Ask the healthcare provider to call the local or state health department.  Limit the number of  people who have contact  with the patient  If possible, have only one caregiver for the patient.  Other household members should stay in another home or place of residence. If this is not possible, they should stay  in another room, or be separated from the patient as much as possible. Use a separate bathroom, if available.  Restrict visitors who do not have an essential need to be in the home.  Keep older adults, very young children, and other sick people away from the patient Keep older adults, very young children, and those who have compromised immune systems or chronic health conditions away from the patient. This includes people with chronic heart, lung, or kidney conditions, diabetes, and cancer.  Ensure good ventilation Make sure that shared spaces in the home have good air flow, such as from an air conditioner or an opened window, weather permitting.  Wash your hands often  Wash your hands often and thoroughly with soap and water for at least 20 seconds. You can use an alcohol based hand sanitizer if soap and water are not available and if your hands are not visibly dirty.  Avoid touching your eyes, nose, and mouth with unwashed hands.  Use disposable paper towels to dry your hands. If not available, use dedicated cloth towels and replace them when they become wet.  Wear a facemask and gloves  Wear a disposable facemask at all times in the room and gloves when you touch or have contact with the patient's blood, body fluids, and/or secretions or excretions, such as sweat, saliva, sputum, nasal mucus, vomit, urine, or feces.  Ensure the mask fits over your nose and mouth tightly, and do not touch it during use.  Throw out disposable facemasks and gloves after using them. Do not reuse.  Wash your hands immediately after removing your facemask and gloves.  If your personal clothing becomes contaminated, carefully remove clothing and launder. Wash your hands after handling contaminated clothing.  Place  all used disposable facemasks, gloves, and other waste in a lined container before disposing them with other household waste.  Remove gloves and wash your hands immediately after handling these items.  Do not share dishes, glasses, or other household items with the patient  Avoid sharing household items. You should not share dishes, drinking glasses, cups, eating utensils, towels, bedding, or other items with a patient who is confirmed to have, or being evaluated for, COVID-19 infection.  After the person uses these items, you should wash them thoroughly with soap and water.  Wash laundry thoroughly  Immediately remove and wash clothes or bedding that have blood, body fluids, and/or secretions or excretions, such as sweat, saliva, sputum, nasal mucus, vomit, urine, or feces, on them.  Wear gloves when handling laundry from the patient.  Read and follow directions on labels of laundry or clothing items and detergent. In general, wash and dry with the warmest temperatures recommended on the label.  Clean all areas the individual has used often  Clean all touchable surfaces, such as counters, tabletops, doorknobs, bathroom fixtures, toilets, phones, keyboards, tablets, and bedside tables, every day. Also, clean any surfaces that may have blood, body fluids, and/or secretions or excretions on them.  Wear gloves when cleaning surfaces the patient has come in contact with.  Use a diluted bleach solution (e.g., dilute bleach with 1 part bleach and 10 parts water) or a household disinfectant with a label that says EPA-registered for coronaviruses. To make a bleach solution at home, add 1  tablespoon of bleach to 1 quart (4 cups) of water. For a larger supply, add  cup of bleach to 1 gallon (16 cups) of water.  Read labels of cleaning products and follow recommendations provided on product labels. Labels contain instructions for safe and effective use of the cleaning product including precautions  you should take when applying the product, such as wearing gloves or eye protection and making sure you have good ventilation during use of the product.  Remove gloves and wash hands immediately after cleaning.  Monitor yourself for signs and symptoms of illness Caregivers and household members are considered close contacts, should monitor their health, and will be asked to limit movement outside of the home to the extent possible. Follow the monitoring steps for close contacts listed on the symptom monitoring form.   ? If you have additional questions, contact your local health department or call the epidemiologist on call at [phone] (available 24/7). ? This guidance is subject to change. For the most up-to-date guidance from [facility], please refer to their website: [employer].hu    ED Prescriptions    Medication Sig Dispense Auth. Provider   clindamycin (CLEOCIN) 300 MG capsule Take 1 capsule (300 mg total) by mouth 3 (three) times daily. 30 capsule Eustace Moore, MD     Controlled Substance Prescriptions [place] Controlled Substance Registry consulted? Not Applicable   Eustace Moore, MD 08/10/19 1313

## 2019-08-10 NOTE — Discharge Instructions (Signed)
Take clindamycin 3 times a day Stop penicillin I have sent a throat culture.  The result will be available in 2 to 3 days.  We will call you if it is positive If your throat culture is negative, you may stop the antibiotics when you feel better.  If your throat culture is positive you need 10 full days of clindamycin I have sent a coronavirus test.  You will be called if this is positive.  Negative tests can be seen on my chart, or you may call the office in 5 days     Person Under Monitoring Name: Taylor Allen  Location: [address] [place] [id number]   Infection Prevention Recommendations for Individuals Confirmed to have, or Being Evaluated for, 2019 Novel Coronavirus (COVID-19) Infection Who Receive Care at Home  Individuals who are confirmed to have, or are being evaluated for, COVID-19 should follow the prevention steps below until a healthcare provider or local or state health department says they can return to normal activities.  Stay home except to get medical care You should restrict activities outside your home, except for getting medical care. Do not go to work, school, or public areas, and do not use public transportation or taxis.  Call ahead before visiting your doctor Before your medical appointment, call the healthcare provider and tell them that you have, or are being evaluated for, COVID-19 infection. This will help the healthcare providers office take steps to keep other people from getting infected. Ask your healthcare provider to call the local or state health department.  Monitor your symptoms Seek prompt medical attention if your illness is worsening (e.g., difficulty breathing). Before going to your medical appointment, call the healthcare provider and tell them that you have, or are being evaluated for, COVID-19 infection. Ask your healthcare provider to call the local or state health department.  Wear a facemask You should wear a facemask that  covers your nose and mouth when you are in the same room with other people and when you visit a healthcare provider. People who live with or visit you should also wear a facemask while they are in the same room with you.  Separate yourself from other people in your home As much as possible, you should stay in a different room from other people in your home. Also, you should use a separate bathroom, if available.  Avoid sharing household items You should not share dishes, drinking glasses, cups, eating utensils, towels, bedding, or other items with other people in your home. After using these items, you should wash them thoroughly with soap and water.  Cover your coughs and sneezes Cover your mouth and nose with a tissue when you cough or sneeze, or you can cough or sneeze into your sleeve. Throw used tissues in a lined trash can, and immediately wash your hands with soap and water for at least 20 seconds or use an alcohol-based hand rub.  Wash your [employer] Wash your hands often and thoroughly with soap and water for at least 20 seconds. You can use an alcohol-based hand sanitizer if soap and water are not available and if your hands are not visibly dirty. Avoid touching your eyes, nose, and mouth with unwashed hands.   Prevention Steps for Caregivers and Household Members of Individuals Confirmed to have, or Being Evaluated for, COVID-19 Infection Being Cared for in the Home  If you live with, or provide care at home for, a person confirmed to have, or being evaluated for,  COVID-19 infection please follow these guidelines to prevent infection:  Follow healthcare providers instructions Make sure that you understand and can help the patient follow any healthcare provider instructions for all care.  Provide for the patients basic needs You should help the patient with basic needs in the home and provide support for getting groceries, prescriptions, and other personal needs.  Monitor  the patients symptoms If they are getting sicker, call his or her medical provider and tell them that the patient has, or is being evaluated for, COVID-19 infection. This will help the healthcare providers office take steps to keep other people from getting infected. Ask the healthcare provider to call the local or state health department.  Limit the number of people who have contact with the patient If possible, have only one caregiver for the patient. Other household members should stay in another home or place of residence. If this is not possible, they should stay in another room, or be separated from the patient as much as possible. Use a separate bathroom, if available. Restrict visitors who do not have an essential need to be in the home.  Keep older adults, very young children, and other sick people away from the patient Keep older adults, very young children, and those who have compromised immune systems or chronic health conditions away from the patient. This includes people with chronic heart, lung, or kidney conditions, diabetes, and cancer.  Ensure good ventilation Make sure that shared spaces in the home have good air flow, such as from an air conditioner or an opened window, weather permitting.  Wash your hands often Wash your hands often and thoroughly with soap and water for at least 20 seconds. You can use an alcohol based hand sanitizer if soap and water are not available and if your hands are not visibly dirty. Avoid touching your eyes, nose, and mouth with unwashed hands. Use disposable paper towels to dry your hands. If not available, use dedicated cloth towels and replace them when they become wet.  Wear a facemask and gloves Wear a disposable facemask at all times in the room and gloves when you touch or have contact with the patients blood, body fluids, and/or secretions or excretions, such as sweat, saliva, sputum, nasal mucus, vomit, urine, or feces.  Ensure the  mask fits over your nose and mouth tightly, and do not touch it during use. Throw out disposable facemasks and gloves after using them. Do not reuse. Wash your hands immediately after removing your facemask and gloves. If your personal clothing becomes contaminated, carefully remove clothing and launder. Wash your hands after handling contaminated clothing. Place all used disposable facemasks, gloves, and other waste in a lined container before disposing them with other household waste. Remove gloves and wash your hands immediately after handling these items.  Do not share dishes, glasses, or other household items with the patient Avoid sharing household items. You should not share dishes, drinking glasses, cups, eating utensils, towels, bedding, or other items with a patient who is confirmed to have, or being evaluated for, COVID-19 infection. After the person uses these items, you should wash them thoroughly with soap and water.  Wash laundry thoroughly Immediately remove and wash clothes or bedding that have blood, body fluids, and/or secretions or excretions, such as sweat, saliva, sputum, nasal mucus, vomit, urine, or feces, on them. Wear gloves when handling laundry from the patient. Read and follow directions on labels of laundry or clothing items and detergent. In general, wash and  dry with the warmest temperatures recommended on the label.  Clean all areas the individual has used often Clean all touchable surfaces, such as counters, tabletops, doorknobs, bathroom fixtures, toilets, phones, keyboards, tablets, and bedside tables, every day. Also, clean any surfaces that may have blood, body fluids, and/or secretions or excretions on them. Wear gloves when cleaning surfaces the patient has come in contact with. Use a diluted bleach solution (e.g., dilute bleach with 1 part bleach and 10 parts water) or a household disinfectant with a label that says EPA-registered for coronaviruses. To make  a bleach solution at home, add 1 tablespoon of bleach to 1 quart (4 cups) of water. For a larger supply, add  cup of bleach to 1 gallon (16 cups) of water. Read labels of cleaning products and follow recommendations provided on product labels. Labels contain instructions for safe and effective use of the cleaning product including precautions you should take when applying the product, such as wearing gloves or eye protection and making sure you have good ventilation during use of the product. Remove gloves and wash hands immediately after cleaning.  Monitor yourself for signs and symptoms of illness Caregivers and household members are considered close contacts, should monitor their health, and will be asked to limit movement outside of the home to the extent possible. Follow the monitoring steps for close contacts listed on the symptom monitoring form.   ? If you have additional questions, contact your local health department or call the epidemiologist on call at [phone] (available 24/7). ? This guidance is subject to change. For the most up-to-date guidance from [facility], please refer to their website: TripMetro.huhttps://www.cdc.gov/coronavirus/2019-ncov/hcp/guidance-prevent-spread.html

## 2019-08-11 LAB — NOVEL CORONAVIRUS, NAA (HOSP ORDER, SEND-OUT TO REF LAB; TAT 18-24 HRS): SARS-CoV-2, NAA: NOT DETECTED

## 2019-08-12 LAB — CULTURE, GROUP A STREP (THRC)

## 2019-08-13 ENCOUNTER — Encounter (HOSPITAL_COMMUNITY): Payer: Self-pay

## 2019-10-01 ENCOUNTER — Encounter (HOSPITAL_COMMUNITY): Payer: Self-pay

## 2019-10-01 ENCOUNTER — Other Ambulatory Visit: Payer: Self-pay

## 2019-10-01 ENCOUNTER — Ambulatory Visit (HOSPITAL_COMMUNITY)
Admission: EM | Admit: 2019-10-01 | Discharge: 2019-10-01 | Disposition: A | Discharge status: Home / Self Care | Payer: Medicaid Other | Attending: Family Medicine | Admitting: Family Medicine

## 2019-10-01 DIAGNOSIS — R509 Fever, unspecified: Secondary | ICD-10-CM

## 2019-10-01 DIAGNOSIS — Z20828 Contact with and (suspected) exposure to other viral communicable diseases: Secondary | ICD-10-CM

## 2019-10-01 DIAGNOSIS — R05 Cough: Secondary | ICD-10-CM | POA: Diagnosis not present

## 2019-10-01 DIAGNOSIS — I10 Essential (primary) hypertension: Secondary | ICD-10-CM | POA: Diagnosis not present

## 2019-10-01 DIAGNOSIS — J3489 Other specified disorders of nose and nasal sinuses: Secondary | ICD-10-CM

## 2019-10-01 DIAGNOSIS — Z20822 Contact with and (suspected) exposure to covid-19: Secondary | ICD-10-CM

## 2019-10-01 NOTE — ED Provider Notes (Signed)
[facility]    CSN: [id number] Arrival date & time: 10/01/19  0910      History   Chief Complaint Chief Complaint  Patient presents with  . covid exposure    HPI Taylor Allen is a 38 y.o. female.   Pt is 38 year old female with past medical history of depression, hypertension, migraine, thyroid disease that presents with exposure to COVID. She was in close contact with co worker, without a mask that tested positive. She has had mild dry cough and rhinorrhea but otherwise feels okay. Low grade fever here today. Hx of allergies. She has not been taking anything for her symptoms.   ROS per HPI      Past Medical History:  Diagnosis Date  . Depression   . Hypertension   . Migraine   . Thyroid disease     There are no active problems to display for this patient.   Past Surgical History:  Procedure Laterality Date  . CHOLECYSTECTOMY    . ENDOMETRIAL ABLATION  10/04/2016    OB History   No obstetric history on file.      Home Medications    Prior to Admission medications   Medication Sig Start Date End Date Taking? Authorizing Provider  Citalopram Hydrobromide (CELEXA PO) Take 20 mg by mouth.     [provider]  HYDROCHLOROTHIAZIDE PO Take by mouth.    [provider]  ibuprofen (ADVIL,MOTRIN) 600 MG tablet Take 1 tablet (600 mg total) by mouth every 8 (eight) hours as needed. 03/03/17   Sherwood Gambler, MD  LEVOTHYROXINE SODIUM PO Take by mouth.    [provider]  LISINOPRIL PO Take 25 mg by mouth.   08/10/19  [provider]  topiramate (TOPAMAX) 50 MG tablet Take 50 mg by mouth 2 (two) times daily.  08/10/19  [provider]  VERAPAMIL HCL PO Take 25 mg by mouth.   08/10/19  [provider]    Family History History reviewed. No pertinent family history.  Social History Social History   Tobacco Use  . Smoking status: Current Every Day Smoker    Packs/day: 0.50    Types: Cigarettes  .  Smokeless tobacco: Never Used  Substance Use Topics  . Alcohol use: No  . Drug use: No     Allergies   Bactrim [sulfamethoxazole-trimethoprim]   Review of Systems Review of Systems   Physical Exam Triage Vital Signs ED Triage Vitals  Enc Vitals Group     BP 10/01/19 0941 (!) 162/95     Pulse Rate 10/01/19 0941 93     Resp 10/01/19 0941 16     Temp 10/01/19 0941 99.5 F (37.5 C)     Temp Source 10/01/19 0941 Oral     SpO2 10/01/19 0941 98 %     Weight --      Height --      Head Circumference --      Peak Flow --      Pain Score 10/01/19 0943 0     Pain Loc --      Pain Edu? --      Excl. in [place]? --    No data found.  Updated Vital Signs BP (!) 162/95 (BP Location: Right Arm)   Pulse 93   Temp 99.5 F (37.5 C) (Oral)   Resp 16   LMP 09/20/2019   SpO2 98%   Visual Acuity Right Eye Distance:   Left Eye Distance:  Bilateral Distance:    Right Eye Near:   Left Eye Near:    Bilateral Near:     Physical Exam Vitals signs and nursing note reviewed.  Constitutional:      General: She is not in acute distress.    Appearance: Normal appearance. She is well-developed.  HENT:     Head: Normocephalic and atraumatic.     Right Ear: Tympanic membrane and ear canal normal.     Left Ear: Tympanic membrane and ear canal normal.     Nose: Nose normal.     Mouth/Throat:     Pharynx: Oropharynx is clear. Posterior oropharyngeal erythema present.  Eyes:     Conjunctiva/sclera: Conjunctivae normal.  Neck:     Musculoskeletal: Neck supple.  Cardiovascular:     Rate and Rhythm: Normal rate and regular rhythm.     Heart sounds: No murmur.  Pulmonary:     Effort: Pulmonary effort is normal. No respiratory distress.     Breath sounds: Normal breath sounds.  Skin:    General: Skin is warm and dry.  Neurological:     Mental Status: She is alert.  Psychiatric:        Mood and Affect: Mood normal.      UC Treatments / Results  Labs (all labs ordered are listed,  but only abnormal results are displayed) Labs Reviewed  NOVEL CORONAVIRUS, NAA (HOSP ORDER, SEND-OUT TO REF LAB; TAT 18-24 HRS)    EKG   Radiology No results found.  Procedures Procedures (including critical care time)  Medications Ordered in UC Medications - No data to display  Initial Impression / Assessment and Plan / UC Course  I have reviewed the triage vital signs and the nursing notes.  Pertinent labs & imaging results that were available during my care of the patient were reviewed by me and considered in my medical decision making (see chart for details).     Exposure to COVID- COVID testing done with labs pending.  Precautions given  Final Clinical Impressions(s) / UC Diagnoses   Final diagnoses:  Exposure to COVID-19 virus     Discharge Instructions     COVID testing pending we should have the results in a few days.  Follow up as needed for continued or worsening symptoms Take precautions.     ED Prescriptions    None     PDMP not reviewed this encounter.   Dahlia Byes A, NP 10/01/19 1019

## 2019-10-01 NOTE — Discharge Instructions (Signed)
COVID testing pending we should have the results in a few days.  Follow up as needed for continued or worsening symptoms Take precautions.

## 2019-10-01 NOTE — ED Triage Notes (Signed)
Pt presents to UC stating she has had exposure to a coworker who tested positive for covid today. Pt has had some sneezing and coughing but no other symptoms.

## 2019-10-03 LAB — NOVEL CORONAVIRUS, NAA (HOSP ORDER, SEND-OUT TO REF LAB; TAT 18-24 HRS): SARS-CoV-2, NAA: NOT DETECTED
